# Patient Record
Sex: Male | Born: 1955 | Race: White | Hispanic: No | Marital: Married | Smoking: Current every day smoker
Health system: Southern US, Community
[De-identification: ages and names within clinical notes are randomized; demographics above are authoritative.]

## PROBLEM LIST (undated history)

## (undated) DIAGNOSIS — Z9989 Dependence on other enabling machines and devices: Secondary | ICD-10-CM

## (undated) DIAGNOSIS — K219 Gastro-esophageal reflux disease without esophagitis: Secondary | ICD-10-CM

## (undated) DIAGNOSIS — G629 Polyneuropathy, unspecified: Secondary | ICD-10-CM

## (undated) DIAGNOSIS — F4310 Post-traumatic stress disorder, unspecified: Secondary | ICD-10-CM

## (undated) DIAGNOSIS — K900 Celiac disease: Secondary | ICD-10-CM

## (undated) DIAGNOSIS — M109 Gout, unspecified: Secondary | ICD-10-CM

## (undated) DIAGNOSIS — J189 Pneumonia, unspecified organism: Secondary | ICD-10-CM

## (undated) DIAGNOSIS — E785 Hyperlipidemia, unspecified: Secondary | ICD-10-CM

## (undated) DIAGNOSIS — Z87442 Personal history of urinary calculi: Secondary | ICD-10-CM

## (undated) DIAGNOSIS — J439 Emphysema, unspecified: Secondary | ICD-10-CM

## (undated) DIAGNOSIS — D649 Anemia, unspecified: Secondary | ICD-10-CM

## (undated) DIAGNOSIS — I10 Essential (primary) hypertension: Secondary | ICD-10-CM

## (undated) DIAGNOSIS — M1990 Unspecified osteoarthritis, unspecified site: Secondary | ICD-10-CM

## (undated) DIAGNOSIS — H9190 Unspecified hearing loss, unspecified ear: Secondary | ICD-10-CM

## (undated) DIAGNOSIS — K5090 Crohn's disease, unspecified, without complications: Secondary | ICD-10-CM

## (undated) DIAGNOSIS — Z972 Presence of dental prosthetic device (complete) (partial): Secondary | ICD-10-CM

## (undated) DIAGNOSIS — K589 Irritable bowel syndrome without diarrhea: Secondary | ICD-10-CM

## (undated) DIAGNOSIS — F319 Bipolar disorder, unspecified: Secondary | ICD-10-CM

## (undated) HISTORY — PX: SHOULDER SURGERY: SHX246

## (undated) HISTORY — PX: OTHER SURGICAL HISTORY: SHX169

## (undated) HISTORY — PX: EYE SURGERY: SHX253

## (undated) HISTORY — DX: Hyperlipidemia, unspecified: E78.5

## (undated) HISTORY — PX: HERNIA REPAIR: SHX51

## (undated) HISTORY — PX: COLONOSCOPY: SHX174

---

## 1992-06-27 HISTORY — PX: EXPLORATORY LAPAROTOMY: SUR591

## 2015-05-01 HISTORY — PX: CYSTOSCOPY W/ URETEROSCOPY W/ LITHOTRIPSY: SUR380

## 2017-06-27 DIAGNOSIS — Z9289 Personal history of other medical treatment: Secondary | ICD-10-CM

## 2017-06-27 HISTORY — DX: Personal history of other medical treatment: Z92.89

## 2017-12-07 HISTORY — PX: CERVICAL SPINE SURGERY: SHX589

## 2018-07-02 DIAGNOSIS — R937 Abnormal findings on diagnostic imaging of other parts of musculoskeletal system: Secondary | ICD-10-CM | POA: Diagnosis not present

## 2018-07-02 DIAGNOSIS — E538 Deficiency of other specified B group vitamins: Secondary | ICD-10-CM | POA: Diagnosis not present

## 2018-07-13 DIAGNOSIS — E538 Deficiency of other specified B group vitamins: Secondary | ICD-10-CM

## 2018-07-13 DIAGNOSIS — R937 Abnormal findings on diagnostic imaging of other parts of musculoskeletal system: Secondary | ICD-10-CM | POA: Diagnosis not present

## 2019-01-26 HISTORY — PX: UPPER GI ENDOSCOPY: SHX6162

## 2019-09-25 ENCOUNTER — Other Ambulatory Visit (HOSPITAL_COMMUNITY): Payer: Self-pay | Admitting: Family Medicine

## 2019-09-25 ENCOUNTER — Other Ambulatory Visit: Payer: Self-pay | Admitting: Family Medicine

## 2019-09-25 DIAGNOSIS — M5137 Other intervertebral disc degeneration, lumbosacral region: Secondary | ICD-10-CM

## 2019-10-05 ENCOUNTER — Ambulatory Visit (HOSPITAL_COMMUNITY)
Admission: RE | Admit: 2019-10-05 | Discharge: 2019-10-05 | Disposition: A | Discharge status: Home / Self Care | Payer: Medicare Other | Source: Ambulatory Visit | Attending: Family Medicine | Admitting: Family Medicine

## 2019-10-05 ENCOUNTER — Other Ambulatory Visit: Payer: Self-pay

## 2019-10-05 DIAGNOSIS — M5137 Other intervertebral disc degeneration, lumbosacral region: Secondary | ICD-10-CM | POA: Insufficient documentation

## 2019-10-26 DIAGNOSIS — J449 Chronic obstructive pulmonary disease, unspecified: Secondary | ICD-10-CM

## 2019-10-26 DIAGNOSIS — R911 Solitary pulmonary nodule: Secondary | ICD-10-CM

## 2019-10-26 HISTORY — DX: Solitary pulmonary nodule: R91.1

## 2019-10-26 HISTORY — DX: Chronic obstructive pulmonary disease, unspecified: J44.9

## 2019-11-19 ENCOUNTER — Ambulatory Visit: Payer: Self-pay | Admitting: Orthopedic Surgery

## 2019-12-03 ENCOUNTER — Ambulatory Visit: Payer: Self-pay | Admitting: Orthopedic Surgery

## 2019-12-03 NOTE — H&P (Addendum)
Subjective:   Logan Roth is a pleasant 64 year old male with chronic pain (in [place] pain management on Percocet 15 mg 5 times a day, Gabapentin 200 mg TID and belbuca 2 mg 1X per day; has tried many injections in the past ?S1 SNRB versus SI joint? which were not helpful) He was referred to [place] for evaluation of his L1 compression fracture. He states that on Valentine's Day he fell backwards and has been having pain since then. This midline low back pain has been progressively worsening.  He would therefore like to move forward with surgical intervention.  Problems Reviewed Problems Hypercholesterolemia - Onset: 10/15/2019 Hypertensive disorder - Onset: 10/15/2019 Pulmonary emphysema - Onset: 10/15/2019 Chronic obstructive lung disease - Onset: 10/15/2019 Chronic back pain - Onset: 10/15/2019 History of calculus of kidney - Onset: 10/15/2019  Family History Reviewed Family History  Social History Reviewed Social History Tobacco Smoking Status: Current every day smoker Smoker (1 PPD) Occupation: Retired Chewing tobacco: none Alcohol intake: None Work related injury?: N Advance directive: N Freight forwarder of Attorney: N Live alone or with others?: with others Marital status: Married  Surgical History Reviewed Surgical History Open stone operation on kidney or renal pelvis Cervical arthrodesis - [place] Colostomy - 06/28/2019 Cataract - 06/27/2012 Shoulder Arthroscopy - 06/27/2008  Past Medical History Reviewed Past Medical History Anemia: Y Anxiety Disorder: Y Bipolar: Y COPD: Y Chronic Back Pain: Y Colitis/Stomach Ulcers: Y Depression: Y Headaches: Y High Cholesterol: Y Hypertension: Y Joint Pain: Y Osteoarthritis: Y Previous Cortisone Injection(s): Y Swelling of Legs/Feet/Hands: Y Weight loss: Y  Current Outpatient Medications  Medication Sig Dispense Refill Last Dose  . BELBUCA 900 MCG FILM Take 1 strip by mouth 2 (two) times daily.     Marland Kitchen DEXILANT 60 MG  capsule Take 60 mg by mouth daily.     . ferrous sulfate 325 (65 FE) MG tablet Take 325 mg by mouth daily.     Marland Kitchen losartan (COZAAR) 50 MG tablet Take 50 mg by mouth daily.     Marland Kitchen oxyCODONE (ROXICODONE) 15 MG immediate release tablet Take 15 mg by mouth 5 (five) times daily as needed for pain.     . pravastatin (PRAVACHOL) 80 MG tablet Take 40 mg by mouth daily.     . traZODone (DESYREL) 100 MG tablet Take 100 mg by mouth at bedtime as needed.     . vitamin B-12 (CYANOCOBALAMIN) 1000 MCG tablet Take 1,000 mcg by mouth daily.      No current facility-administered medications for this visit.   No Known Allergies  Social History   Tobacco Use  . Smoking status: Not on file  Substance Use Topics  . Alcohol use: Not on file     Review of Systems As stated in HPI  Objective:   Vitals: Ht: 5 ft 8.5 in 12/03/2019 10:55 am Wt: 205 lbs 12/03/2019 10:55 am BMI: 30.7 12/03/2019 10:55 am BP: 136/75 12/03/2019 10:56 am Pulse: 77 bpm 12/03/2019 10:56 am  General: AAOX3, well developed and well nourished, NAD  Ambulation: antalgic gait pattern, uses cane assistive device.  Inspection: No obvious deformity, scoleosis, kyphosis, loss of lordotic curve.  Palpation: Very mildly tender over spinous processes L1. Significant the tender palpation over left SI joint.  AROM: - Significant pain with range of motion of the lumbar spine. - Knee: flexion and extension normal and pain free bilaterally. - Ankle: Dorsiflexion, plantarflexion, inversion, eversion normal and pain free.  Dermatomes: Lower extremity sensation to light touch intact bilaterally  Myotomes: -  Hip Flexion: Left 4/5 (suspect weakness due to pain), Right 4/5 (suspect weakness due to pain) - Knee Extension: Left 5/5, Right 5/5 - Ankle Dorsiflextion: Left 5/5, Right 5/5 - Ankle Plantarflexion: Left 5/5, Right 5/5  Reflexes: - Patella: Left2+, Right 2+ - Achilles: Left2+, Right 2+ - Babinski: Left Ngative, Right Negative -  Clonus: Negative  Special Tests: - Straight Leg Raise: Left Negative, Right Negative  PV: Extremities warm and well profused. Posterior and dorsalis pedis pulse 2+ bilaterally, No pitting Edema, discoloration, calf tenderness, or palpable cords.  X-Ray impression: AP and lateral thoracolumbar x-rays were taken at today's office visit and images were personally reviewed by me. The patient has reduced kyphosis and lumbar lordosis. There is a L1 compression deformity that does not appear to be further collapsed compared to the prior MRI. No new fractures are seen.  X-rays taken today in the office demonstrate no significant change of the L1 compression fracture. No new fracture is seen.   MRI Impression: MRI of lumbar spine taken outside facility dated 10/06/2019. Both images as well as the report were personally reviewed by me. There is a L1 compression fracture with up to 30% height loss without any significant retropulsion or stenosis that is benign and mechanical in appearance. There is also right foraminal and extraforaminal disc protrusions at T12 and L1, L2-3 and L3-4 potentially affecting the exiting right-sided nerve roots. In a disc bulge with facet hypertrophy at L4-5 and L5-S1 with mild to moderate bilateral foraminal narrowing at L4-5 on the left and at L5-S1.  Assessment:   Logan Roth returns today because of ongoing severe debilitating back pain. He states it has intensified since his last visit of 10/15/19. I have reviewed the MRI and the new x-rays and it is clear he has symptomatic back pain due to the L1 compression fracture. At this point we have gone over the kyphoplasty procedure in great detail. All of his and his wife's questions were addressed. He is expressed a desire to move forward with this procedure.  Plan:   Risks of surgery include: Infection, bleeding, death, stroke, paralysis, nerve damage, leak of cement, need for additional surgery including open decompression. Ongoing  or worse pain.  Goals of surgery: Reduction in pain, and improvement in quality of life  We have obtained preoperative medical clearance from the patient's primary care provider as well as his pulmonologist.  He is also scheduled on the 11th to meet with the cardiologist.  He is not on any blood thinners, he is not taking any aspirin.  He is also not using any anti-inflammatory medications.  I have advised him to avoid any anti-inflammatory medications prior to surgery he did express understanding of this.  We have also discussed the post-operative recovery period to include: bathing/showering restrictions,  wound healing, activity (and driving) restrictions, medications/pain mangement.  Patient has an appointment with his pain management provider prior to surgery, they will be managing his postoperative pain.  We have also discussed post-operative redflags to include: signs and symptoms of postoperative infection, DVT/PE.  All of patient's questions were invited and answered  Follow-up: 2 weeks postoperatively

## 2019-12-05 ENCOUNTER — Encounter (HOSPITAL_COMMUNITY): Payer: Self-pay

## 2019-12-05 NOTE — Pre-Procedure Instructions (Signed)
[facility] [place] AT [address] [place] [address] Phone: [phone] Fax: [phone]     Your procedure is scheduled on Thursday, December 12 2019 from 10:40 AM- 11:40 AM.  Report to [facility] Main Entrance "A" at 08:40 A.M., and check in at the Admitting office.  Call this number if you have problems the morning of surgery:  [phone]  Call [phone] if you have any questions prior to your surgery date Monday-Friday 8am-4pm.    Remember:  Do not eat or drink after midnight the night before your surgery.     Take these medicines the morning of surgery with A SIP OF WATER: DEXILANT  pravastatin (PRAVACHOL)  IF NEEDED: oxyCODONE (ROXICODONE)  *Stop BELBUCA 3 days prior to surgery.  As of today, STOP taking any Aspirin (unless otherwise instructed by your surgeon) and Aspirin containing products, Aleve, Naproxen, Ibuprofen, Motrin, Advil, Goody's, BC's, all herbal medications, fish oil, and all vitamins.          The Morning of Surgery:            Do not wear jewelry.            Do not wear lotions, powders, colognes, or deodorant.            Men may shave face and neck.            Do not bring valuables to the hospital.            [facility] is not responsible for any belongings or valuables.  Do NOT Smoke (Tobacco/Vapping) or drink Alcohol 24 hours prior to your procedure.  If you use a CPAP at night, you may bring all equipment for your overnight stay.   Contacts, glasses, dentures or bridgework may not be worn into surgery.      For patients admitted to the hospital, discharge time will be determined by your treatment team.   Patients discharged the day of surgery will not be allowed to drive home, and someone needs to stay with them for 24 hours.    Special instructions:   [place]- Preparing For Surgery  Before surgery, you can play an important role. Because skin is  not sterile, your skin needs to be as free of germs as possible. You can reduce the number of germs on your skin by washing with CHG (chlorahexidine gluconate) Soap before surgery.  CHG is an antiseptic cleaner which kills germs and bonds with the skin to continue killing germs even after washing.    Oral Hygiene is also important to reduce your risk of infection.  Remember - BRUSH YOUR TEETH THE MORNING OF SURGERY WITH YOUR REGULAR TOOTHPASTE  Please do not use if you have an allergy to CHG or antibacterial soaps. If your skin becomes reddened/irritated stop using the CHG.  Do not shave (including legs and underarms) for at least 48 hours prior to first CHG shower. It is OK to shave your face.  Please follow these instructions carefully.   1. Shower the NIGHT BEFORE SURGERY and the MORNING OF SURGERY with CHG Soap.   2. If you chose to wash your hair, wash your hair first as usual with your normal shampoo.  3. After you shampoo, rinse your hair and body thoroughly to remove the shampoo.  4. Use CHG as you would any other liquid soap. You can apply  CHG directly to the skin and wash gently with a scrungie or a clean washcloth.   5. Apply the CHG Soap to your body ONLY FROM THE NECK DOWN.  Do not use on open wounds or open sores. Avoid contact with your eyes, ears, mouth and genitals (private parts). Wash Face and genitals (private parts)  with your normal soap.   6. Wash thoroughly, paying special attention to the area where your surgery will be performed.  7. Thoroughly rinse your body with warm water from the neck down.  8. DO NOT shower/wash with your normal soap after using and rinsing off the CHG Soap.  9. Pat yourself dry with a CLEAN TOWEL.  10. Wear CLEAN PAJAMAS to bed the night before surgery, wear comfortable clothes the morning of surgery  11. Place CLEAN SHEETS on your bed the night of your first shower and DO NOT SLEEP WITH PETS.   Day of Surgery: Shower with CHG Soap.   Do not apply any deodorants/lotions.  Please wear clean clothes to the hospital/surgery center.   Remember to brush your teeth WITH YOUR REGULAR TOOTHPASTE.   Please read over the following fact sheets that you were given.

## 2019-12-05 NOTE — Progress Notes (Signed)
Patient denies shortness of breath, fever, cough or chest pain.  PCP - Dr Andrey Campanile  Pain Mgmt - Dr Mikael Spray Cardiologist - n/a Pulmonology - Dr Paris Lore  Chest x-ray - 12/06/19 EKG - 12/06/19 Stress Test - n/a - one is scheduled 12/09/19  ECHO - n/a Cardiac Cath - n/a  Anesthesia review: Yes  Per Anesthesia, Stop Belbuca 3 days prior to surgery if ok with your prescribing MD.  Coronavirus Screening Covid test scheduled on Monday, 12/09/19. Do you have any of the following symptoms:  Cough yes/no: No Fever (>100.52F)  yes/no: No Runny nose yes/no: No Sore throat yes/no: No Difficulty breathing/shortness of breath  yes/no: No  Have you traveled in the last 14 days and where? yes/no: No  Patient verbalized understanding of instructions that were given to them at the PAT appointment.

## 2019-12-06 ENCOUNTER — Other Ambulatory Visit: Payer: Self-pay

## 2019-12-06 ENCOUNTER — Encounter (HOSPITAL_COMMUNITY): Payer: Self-pay | Admitting: Physician Assistant

## 2019-12-06 ENCOUNTER — Ambulatory Visit (HOSPITAL_COMMUNITY)
Admission: RE | Admit: 2019-12-06 | Discharge: 2019-12-06 | Disposition: A | Discharge status: Home / Self Care | Payer: Medicare Other | Source: Ambulatory Visit | Attending: Orthopedic Surgery | Admitting: Orthopedic Surgery

## 2019-12-06 ENCOUNTER — Encounter (HOSPITAL_COMMUNITY)
Admission: RE | Admit: 2019-12-06 | Discharge: 2019-12-06 | Disposition: A | Discharge status: Home / Self Care | Payer: Medicare Other | Source: Ambulatory Visit | Attending: Orthopedic Surgery | Admitting: Orthopedic Surgery

## 2019-12-06 ENCOUNTER — Encounter (HOSPITAL_COMMUNITY): Payer: Self-pay

## 2019-12-06 DIAGNOSIS — Z01818 Encounter for other preprocedural examination: Secondary | ICD-10-CM | POA: Diagnosis present

## 2019-12-06 DIAGNOSIS — I2510 Atherosclerotic heart disease of native coronary artery without angina pectoris: Secondary | ICD-10-CM | POA: Diagnosis not present

## 2019-12-06 DIAGNOSIS — Z8249 Family history of ischemic heart disease and other diseases of the circulatory system: Secondary | ICD-10-CM | POA: Diagnosis not present

## 2019-12-06 HISTORY — DX: Crohn's disease, unspecified, without complications: K50.90

## 2019-12-06 HISTORY — DX: Unspecified hearing loss, unspecified ear: H91.90

## 2019-12-06 HISTORY — DX: Dependence on other enabling machines and devices: Z99.89

## 2019-12-06 HISTORY — DX: Irritable bowel syndrome without diarrhea: K58.9

## 2019-12-06 HISTORY — DX: Gastro-esophageal reflux disease without esophagitis: K21.9

## 2019-12-06 HISTORY — DX: Essential (primary) hypertension: I10

## 2019-12-06 HISTORY — DX: Celiac disease: K90.0

## 2019-12-06 HISTORY — DX: Emphysema, unspecified: J43.9

## 2019-12-06 HISTORY — DX: Anemia, unspecified: D64.9

## 2019-12-06 HISTORY — DX: Personal history of urinary calculi: Z87.442

## 2019-12-06 HISTORY — DX: Presence of dental prosthetic device (complete) (partial): Z97.2

## 2019-12-06 HISTORY — DX: Gout, unspecified: M10.9

## 2019-12-06 HISTORY — DX: Pneumonia, unspecified organism: J18.9

## 2019-12-06 HISTORY — DX: Hyperlipidemia, unspecified: E78.5

## 2019-12-06 HISTORY — DX: Unspecified osteoarthritis, unspecified site: M19.90

## 2019-12-06 LAB — CBC
HCT: 44.5 % (ref 39.0–52.0)
Hemoglobin: 13.7 g/dL (ref 13.0–17.0)
MCH: 28.3 pg (ref 26.0–34.0)
MCHC: 30.8 g/dL (ref 30.0–36.0)
MCV: 91.9 fL (ref 80.0–100.0)
Platelets: 383 10*3/uL (ref 150–400)
RBC: 4.84 MIL/uL (ref 4.22–5.81)
RDW: 15.6 % — ABNORMAL HIGH (ref 11.5–15.5)
WBC: 9.7 10*3/uL (ref 4.0–10.5)
nRBC: 0 % (ref 0.0–0.2)

## 2019-12-06 LAB — BASIC METABOLIC PANEL
Anion gap: 12 (ref 5–15)
BUN: 12 mg/dL (ref 8–23)
CO2: 23 mmol/L (ref 22–32)
Calcium: 9.1 mg/dL (ref 8.9–10.3)
Chloride: 108 mmol/L (ref 98–111)
Creatinine, Ser: 1.1 mg/dL (ref 0.61–1.24)
GFR calc Af Amer: >60 mL/min (ref >60)
GFR calc non Af Amer: >60 mL/min (ref >60)
Glucose, Bld: 117 mg/dL — ABNORMAL HIGH (ref 70–99)
Potassium: 4.2 mmol/L (ref 3.5–5.1)
Sodium: 143 mmol/L (ref 135–145)

## 2019-12-06 LAB — APTT: aPTT: 28 seconds (ref 24–36)

## 2019-12-06 LAB — PROTIME-INR
INR: 1 (ref 0.8–1.2)
Prothrombin Time: 13.1 seconds (ref 11.4–15.2)

## 2019-12-06 LAB — TYPE AND SCREEN
ABO/RH(D): A POS
Antibody Screen: NEGATIVE

## 2019-12-06 LAB — ABO/RH: ABO/RH(D): A POS

## 2019-12-06 LAB — SURGICAL PCR SCREEN
MRSA, PCR: NEGATIVE
Staphylococcus aureus: NEGATIVE

## 2019-12-09 ENCOUNTER — Other Ambulatory Visit (HOSPITAL_COMMUNITY): Payer: Medicare Other

## 2019-12-11 ENCOUNTER — Telehealth (HOSPITAL_COMMUNITY): Payer: Self-pay | Admitting: *Deleted

## 2019-12-11 ENCOUNTER — Other Ambulatory Visit (HOSPITAL_COMMUNITY): Payer: Self-pay | Admitting: Family Medicine

## 2019-12-11 DIAGNOSIS — I2510 Atherosclerotic heart disease of native coronary artery without angina pectoris: Secondary | ICD-10-CM

## 2019-12-11 NOTE — Progress Notes (Signed)
Anesthesia Chart Review:  Reviewed office visit note dated 12/06/2019 from Dr. Mikael Spray at [facility].  Note states patient had three-vessel coronary calcification on recent CTA and strong family history of CAD with both his mother and father suffering fatal myocardial infarctions in their 65s.  It was recommended he undergo nuclear stress test prior to undergoing kyphoplasty procedure.  It appears she is originally scheduled to have this testing on 12/09/2019 but for unclear reasons it was rescheduled.  He has not had the stress test at this time.  Given this, he will need to have his kyphoplasty procedure rescheduled once he has completed the recommended cardiovascular testing.  I have relayed this message to Dr. Gary Fleet surgical scheduler.   Zannie Cove [facility] Short Stay Center/Anesthesiology Phone [phone] 12/11/2019 10:42 AM

## 2019-12-11 NOTE — Telephone Encounter (Signed)
Patient given detailed instructions per Myocardial Perfusion Study Information Sheet for the test on 12/13/2019 at 1030. Patient notified to arrive 15 minutes early and that it is imperative to arrive on time for appointment to keep from having the test rescheduled.  If you need to cancel or reschedule your appointment, please call the office within 24 hours of your appointment. . Patient verbalized understanding.Logan Roth, Logan Roth No mychart available

## 2019-12-12 ENCOUNTER — Encounter (HOSPITAL_COMMUNITY): Admission: RE | Payer: Self-pay | Source: Home / Self Care

## 2019-12-12 ENCOUNTER — Ambulatory Visit (HOSPITAL_COMMUNITY): Admission: RE | Admit: 2019-12-12 | Payer: Medicare Other | Source: Home / Self Care | Admitting: Orthopedic Surgery

## 2019-12-12 SURGERY — KYPHOPLASTY
Anesthesia: General

## 2019-12-13 ENCOUNTER — Ambulatory Visit (HOSPITAL_COMMUNITY): Payer: Medicare Other | Attending: Cardiovascular Disease

## 2019-12-13 ENCOUNTER — Other Ambulatory Visit: Payer: Self-pay

## 2019-12-13 DIAGNOSIS — I2510 Atherosclerotic heart disease of native coronary artery without angina pectoris: Secondary | ICD-10-CM | POA: Insufficient documentation

## 2019-12-13 LAB — MYOCARDIAL PERFUSION IMAGING
LV dias vol: 106 mL (ref 62–150)
LV sys vol: 51 mL
Peak HR: 81 {beats}/min
Rest HR: 65 {beats}/min
SDS: 3
SRS: 0
SSS: 3
TID: 1.05

## 2019-12-13 MED ORDER — TECHNETIUM TC 99M TETROFOSMIN IV KIT
33.0000 | PACK | Freq: Once | INTRAVENOUS | Status: AC | PRN
  Administered 2019-12-13: 33 via INTRAVENOUS
  Filled 2019-12-13: qty 33

## 2019-12-13 MED ORDER — TECHNETIUM TC 99M TETROFOSMIN IV KIT
9.8000 | PACK | Freq: Once | INTRAVENOUS | Status: AC | PRN
  Administered 2019-12-13: 9.8 via INTRAVENOUS
  Filled 2019-12-13: qty 10

## 2019-12-13 MED ORDER — REGADENOSON 0.4 MG/5ML IV SOLN
0.4000 mg | Freq: Once | INTRAVENOUS | Status: AC
  Administered 2019-12-13: 0.4 mg via INTRAVENOUS

## 2019-12-16 ENCOUNTER — Telehealth: Payer: Self-pay | Admitting: Family Medicine

## 2019-12-16 NOTE — Telephone Encounter (Signed)
Follow Up:   Logan Roth from Dr Dairl Ponder office called. She is checking on the status of pt's Stress Test that he need for surgical clearance. Please fax this to  [phone].

## 2019-12-16 NOTE — Telephone Encounter (Signed)
I s/w Sheery with Dr. Shon Baton office. Cordelia Pen was asking if we could please fax stress test results. I explained to [facility] that Mr. Hawthorne has never been seen by our cardiology office and the stress test was ordered by the PCP. I gave Sheery both the ph# and fax # for Dr. Mikael Spray (PCP) and that she will need to contact their office for the stress test results.

## 2019-12-23 ENCOUNTER — Telehealth: Payer: Self-pay | Admitting: Family Medicine

## 2019-12-23 NOTE — Telephone Encounter (Signed)
Tammy, from [facility] called for Stress test results to this patient. It can be faxed to [phone]

## 2019-12-23 NOTE — Telephone Encounter (Signed)
Attempted to call [facility] x 2 and was disconnected both times.  Called to assist with questions about getting pts stress test results, which were ordered by PCP.  Pt is not established with our Group.  Appears PCP ordered stress test on this pt and was done at our facility, but results to go back to PCP to further advise on surgical clearance needed, based on results once read by our establishment.

## 2019-12-24 ENCOUNTER — Ambulatory Visit: Payer: Self-pay | Admitting: Orthopedic Surgery

## 2019-12-26 NOTE — Progress Notes (Signed)
Anesthesia Chart Review: Same day workup  Case previously postponed due to need for preop cardiac testing. See my recent note 12/06/19. In the interim pt has been seen by cardiologist Dr. Clelia Croft at [facility] medical and underwent nuclear stress that was normal and he was subsequently cleared for surgery. Per clearance note dated 12/23/19, "Logan Roth has been under my care for his cardiovascular health.  His cardiovascular condition remained stable with updated nuclear stress test being acquired at [facility] and vascular Center on December 13, 2019.  Patient was identified to have apical hypokinesis by gated images with an overall preserved ejection fraction 52%.  Myocardial perfusion was normal showing no reversible or fixed defects.  Patient overall is medically stable and hemodynamically ready to proceed with elective lumbar orthopedic surgery.  Overall perioperative risk of cardiovascular complications are low and patient is otherwise cleared to proceed with L1 kyphoplasty with Dr. Venita Lick."  Patient follows with pulmonologist Dr. Sande Brothers for history of emphysema.  Preop clearance dated 11/29/2019 states "patient has no contraindications to surgery and appears to be at low risk for perisurgical complications.  He has mild airway obstruction on medical therapy for emphysema."  Patient also has medical clearance from PCP Dr. Andrey Campanile dated 11/28/2019.  Labs from 12/06/2019 were unremarkable.   EKG 12/06/2019: Normal sinus rhythm.  Rate 68.  Right bundle branch block. Left anterior fascicular block  CHEST - 2 VIEW 12/06/2019:  COMPARISON:  09/20/2018  FINDINGS: The heart size and mediastinal contours are within normal limits. Both lungs are clear. The visualized skeletal structures are unremarkable.  IMPRESSION: No active cardiopulmonary disease.   Nuclear stress 12/13/2019: Normal perfusion Ef estimated at 52% with apical hypokinesis on gated images LV appears enlarged Low risk study no  ischemia Baseline ECG with PAC and RBBB   Zannie Cove [facility] Short Stay Center/Anesthesiology Phone [phone] 12/26/2019 4:20 PM

## 2019-12-26 NOTE — Anesthesia Preprocedure Evaluation (Addendum)
Anesthesia Evaluation  Patient identified by MRN, date of birth, ID band Patient awake    Reviewed: Allergy & Precautions, NPO status , Patient's Chart, lab work & pertinent test results  History of Anesthesia Complications Negative for: history of anesthetic complications  Airway Mallampati: II  TM Distance: >3 FB Neck ROM: Limited    Dental  (+) Lower Dentures, Upper Dentures   Pulmonary COPD, Current SmokerPatient did not abstain from smoking.,    Pulmonary exam normal        Cardiovascular hypertension, Pt. on medications Normal cardiovascular exam     Neuro/Psych PSYCHIATRIC DISORDERS Anxiety Bipolar Disorder  Neuromuscular disease (neuropathy)    GI/Hepatic Neg liver ROS, GERD  Controlled and Medicated, Celiac disease Crohn's Disease   Endo/Other   Obesity   Renal/GU negative Renal ROS     Musculoskeletal  (+) Arthritis ,   Abdominal   Peds  Hematology negative hematology ROS (+)   Anesthesia Other Findings On belbuca Covid test negative See PAT note regarding cardiac clearance  Reproductive/Obstetrics                           Anesthesia Physical Anesthesia Plan  ASA: III  Anesthesia Plan: MAC   Post-op Pain Management:    Induction: Intravenous  PONV Risk Score and Plan: 1 and Propofol infusion and Treatment may vary due to age or medical condition  Airway Management Planned: Nasal Cannula and Natural Airway  Additional Equipment: None  Intra-op Plan:   Post-operative Plan:   Informed Consent: I have reviewed the patients History and Physical, chart, labs and discussed the procedure including the risks, benefits and alternatives for the proposed anesthesia with the patient or authorized representative who has indicated his/her understanding and acceptance.       Plan Discussed with: CRNA, Anesthesiologist and Surgeon  Anesthesia Plan Comments:       Anesthesia Quick Evaluation

## 2019-12-31 ENCOUNTER — Encounter (HOSPITAL_COMMUNITY): Payer: Self-pay

## 2019-12-31 ENCOUNTER — Other Ambulatory Visit (HOSPITAL_COMMUNITY)
Admission: RE | Admit: 2019-12-31 | Discharge: 2019-12-31 | Disposition: A | Discharge status: Home / Self Care | Payer: Medicare Other | Source: Ambulatory Visit | Attending: Orthopedic Surgery | Admitting: Orthopedic Surgery

## 2019-12-31 ENCOUNTER — Encounter (HOSPITAL_COMMUNITY)
Admission: RE | Admit: 2019-12-31 | Discharge: 2019-12-31 | Disposition: A | Discharge status: Home / Self Care | Payer: Medicare Other | Source: Ambulatory Visit | Attending: Orthopedic Surgery | Admitting: Orthopedic Surgery

## 2019-12-31 ENCOUNTER — Ambulatory Visit: Payer: Self-pay | Admitting: Orthopedic Surgery

## 2019-12-31 ENCOUNTER — Other Ambulatory Visit: Payer: Self-pay

## 2019-12-31 DIAGNOSIS — Z20822 Contact with and (suspected) exposure to covid-19: Secondary | ICD-10-CM | POA: Insufficient documentation

## 2019-12-31 DIAGNOSIS — Z01812 Encounter for preprocedural laboratory examination: Secondary | ICD-10-CM | POA: Diagnosis not present

## 2019-12-31 HISTORY — DX: Post-traumatic stress disorder, unspecified: F43.10

## 2019-12-31 HISTORY — DX: Polyneuropathy, unspecified: G62.9

## 2019-12-31 HISTORY — DX: Bipolar disorder, unspecified: F31.9

## 2019-12-31 LAB — BASIC METABOLIC PANEL
Anion gap: 11 (ref 5–15)
BUN: 13 mg/dL (ref 8–23)
CO2: 22 mmol/L (ref 22–32)
Calcium: 9 mg/dL (ref 8.9–10.3)
Chloride: 108 mmol/L (ref 98–111)
Creatinine, Ser: 1.11 mg/dL (ref 0.61–1.24)
GFR calc Af Amer: >60 mL/min (ref >60)
GFR calc non Af Amer: >60 mL/min (ref >60)
Glucose, Bld: 111 mg/dL — ABNORMAL HIGH (ref 70–99)
Potassium: 4.6 mmol/L (ref 3.5–5.1)
Sodium: 141 mmol/L (ref 135–145)

## 2019-12-31 LAB — TYPE AND SCREEN
ABO/RH(D): A POS
Antibody Screen: NEGATIVE

## 2019-12-31 LAB — CBC
HCT: 42.4 % (ref 39.0–52.0)
Hemoglobin: 13.1 g/dL (ref 13.0–17.0)
MCH: 28 pg (ref 26.0–34.0)
MCHC: 30.9 g/dL (ref 30.0–36.0)
MCV: 90.6 fL (ref 80.0–100.0)
Platelets: 341 10*3/uL (ref 150–400)
RBC: 4.68 MIL/uL (ref 4.22–5.81)
RDW: 14.9 % (ref 11.5–15.5)
WBC: 4.7 10*3/uL (ref 4.0–10.5)
nRBC: 0 % (ref 0.0–0.2)

## 2019-12-31 LAB — SARS CORONAVIRUS 2 (TAT 6-24 HRS): SARS Coronavirus 2: NEGATIVE

## 2019-12-31 NOTE — H&P (Deleted)
  The note originally documented on this encounter has been moved the the encounter in which it belongs.  

## 2019-12-31 NOTE — Telephone Encounter (Signed)
Results faxed to the number provided

## 2019-12-31 NOTE — Pre-Procedure Instructions (Signed)
Logan Roth  12/31/2019    Your procedure is scheduled on Thursday, July 8..  Report to [facility], Main Entrance or Entrance "A" at 8:50 AM                   Your surgery or procedure is scheduled to begin at 10:50 A.M.   Call this number if you have problems the morning of surgery: [phone]  This is the number for the Pre- Surgical Desk.                For any other questions, please call [phone], Monday - Friday 8 AM - 4 PM.   Remember:  Do not eat or drink after midnight the night before your surgery.   Take these medicines the morning of surgery with A SIP OF WATER: amLODipine (NORVASC)      DEXILANT       DULoxetine (CYMBALTA) Tiotropium Bromide Monohydrate (SPIRIVA RESPIMAT)   Take if needed:  oxyCODONE (ROXICODONE) albuterol (VENTOLIN HFA) inhaler- bring it with you to the hospital.  Stop Belbuca 3 days prior to surgery   STOP taking Aspirin, Aspirin Products (Goody Powder, Excedrin Migraine), Ibuprofen (Advil), Naproxen (Aleve), Vitamins and Herbal Products (ie Fish Oil). and Loratadine-pseudoephedrine (CLARITIN-D 12-HOUR).  Special instructions:  [place]- Preparing For Surgery  Before surgery, you can play an important role. Because skin is not sterile, your skin needs to be as free of germs as possible. You can reduce the number of germs on your skin by washing with CHG (chlorahexidine gluconate) Soap before surgery.  CHG is an antiseptic cleaner which kills germs and bonds with the skin to continue killing germs even after washing.    Oral Hygiene is also important to reduce your risk of infection.  Remember - BRUSH YOUR TEETH THE MORNING OF SURGERY WITH YOUR REGULAR TOOTHPASTE  Please do not use if you have an allergy to CHG or antibacterial soaps. If your skin becomes reddened/irritated stop using the CHG.  Do not shave (including legs and underarms) for at least 48 hours prior to first CHG shower. It is OK to shave your face.  Please  follow these instructions carefully.   1. Shower the NIGHT BEFORE SURGERY and the MORNING OF SURGERY with CHG.   2. If you chose to wash your hair, wash your hair first as usual with your normal shampoo.  3. After you shampoo, wash your face and private area with the soap you use at home, then rinse your hair and body thoroughly to remove the shampoo and soap.   4. Use CHG as you would any other liquid soap. You can apply CHG directly to the skin and wash gently with a scrungie or a clean washcloth.   5. Apply the CHG Soap to your body ONLY FROM THE NECK DOWN.  Do not use on open wounds or open sores. Avoid contact with your eyes, ears, mouth and genitals (private parts).  6. Wash thoroughly, paying special attention to the area where your surgery will be performed.  7. Thoroughly rinse your body with warm water from the neck down.  8. DO NOT shower/wash with your normal soap after using and rinsing off the CHG Soap.  9. Pat yourself dry with a CLEAN TOWEL.  10. Wear CLEAN PAJAMAS to bed the night before surgery, wear comfortable clothes the morning of surgery  11. Place CLEAN SHEETS on your bed the night of your first shower and DO NOT SLEEP  WITH PETS.  Day of Surgery: Shower as instructed above. Do not wear lotions, powders, or cologne, or deodorant. Please wear clean clothes to the hospital/surgery center.   Remember to brush your teeth WITH YOUR REGULAR TOOTHPASTE.             Do not wear jewelry, make-up or nail polish.             Men may shave face and neck.  Do not bring valuables to the hospital.  [facility] is not responsible for any belongings or valuables.  Contacts, dentures or bridgework may not be worn into surgery.  Leave your suitcase in the car.  After surgery it may be brought to your room.  For patients admitted to the hospital, discharge time will be determined by your treatment team.  Patients discharged the day of surgery will not be allowed to drive  home.   Please read over the fact sheets that you were given.

## 2019-12-31 NOTE — Progress Notes (Signed)
I called Dr. Shon Baton' office and left [facility] a voice message asking for lab orders by 1100 and informed her that if we do not see new orders that labs anesthesiologist; CBC, BMP, T/S.
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
## 2019-12-31 NOTE — Progress Notes (Signed)
PCP - Dr. Benedetto Goad-   Cardiologist -  Dr. Fritz Pickerel- cardiac clearance given  Pulmonologist Dr. Sande Brothers- gave pulmonary clearance  Chest x-ray -  12/06/2019  EKG - 12/06/2019  Stress Test - 12/13/2019  ECHO - no  Cardiac Cath - no  Sleep Study - test was negative  CPAP - no  LABS-CBC,BMP, T?S.  ASA-no  ERAS-yes  HA1C-na Fasting Blood Sugar - na Checks Blood Sugar ___na__ times a day  Anesthesia-  Pt denies having chest pain, sob, or fever at this time. All instructions explained to the pt, with a verbal understanding of the material. Pt agrees to go over the instructions while at home for a better understanding. Pt also instructed to self quarantine after being tested for COVID-19. The opportunity to ask questions was provided.

## 2020-01-02 ENCOUNTER — Ambulatory Visit (HOSPITAL_COMMUNITY)
Admission: RE | Admit: 2020-01-02 | Discharge: 2020-01-02 | Disposition: A | Discharge status: Home / Self Care | Payer: Medicare Other | Attending: Orthopedic Surgery | Admitting: Orthopedic Surgery

## 2020-01-02 ENCOUNTER — Encounter (HOSPITAL_COMMUNITY): Payer: Self-pay | Admitting: Orthopedic Surgery

## 2020-01-02 ENCOUNTER — Ambulatory Visit (HOSPITAL_COMMUNITY): Payer: Medicare Other | Admitting: Physician Assistant

## 2020-01-02 ENCOUNTER — Encounter (HOSPITAL_COMMUNITY)
Admission: RE | Disposition: A | Discharge status: Home / Self Care | Payer: Self-pay | Source: Home / Self Care | Attending: Orthopedic Surgery

## 2020-01-02 ENCOUNTER — Ambulatory Visit (HOSPITAL_COMMUNITY): Payer: Medicare Other

## 2020-01-02 ENCOUNTER — Other Ambulatory Visit: Payer: Self-pay

## 2020-01-02 DIAGNOSIS — E7800 Pure hypercholesterolemia, unspecified: Secondary | ICD-10-CM | POA: Diagnosis not present

## 2020-01-02 DIAGNOSIS — K5090 Crohn's disease, unspecified, without complications: Secondary | ICD-10-CM | POA: Diagnosis not present

## 2020-01-02 DIAGNOSIS — X58XXXA Exposure to other specified factors, initial encounter: Secondary | ICD-10-CM | POA: Insufficient documentation

## 2020-01-02 DIAGNOSIS — Z8249 Family history of ischemic heart disease and other diseases of the circulatory system: Secondary | ICD-10-CM | POA: Diagnosis not present

## 2020-01-02 DIAGNOSIS — F319 Bipolar disorder, unspecified: Secondary | ICD-10-CM | POA: Insufficient documentation

## 2020-01-02 DIAGNOSIS — E669 Obesity, unspecified: Secondary | ICD-10-CM | POA: Diagnosis not present

## 2020-01-02 DIAGNOSIS — I2510 Atherosclerotic heart disease of native coronary artery without angina pectoris: Secondary | ICD-10-CM | POA: Insufficient documentation

## 2020-01-02 DIAGNOSIS — G8929 Other chronic pain: Secondary | ICD-10-CM | POA: Diagnosis not present

## 2020-01-02 DIAGNOSIS — Z79891 Long term (current) use of opiate analgesic: Secondary | ICD-10-CM | POA: Insufficient documentation

## 2020-01-02 DIAGNOSIS — G629 Polyneuropathy, unspecified: Secondary | ICD-10-CM | POA: Diagnosis not present

## 2020-01-02 DIAGNOSIS — I10 Essential (primary) hypertension: Secondary | ICD-10-CM | POA: Diagnosis not present

## 2020-01-02 DIAGNOSIS — Z79899 Other long term (current) drug therapy: Secondary | ICD-10-CM | POA: Insufficient documentation

## 2020-01-02 DIAGNOSIS — E559 Vitamin D deficiency, unspecified: Secondary | ICD-10-CM | POA: Diagnosis not present

## 2020-01-02 DIAGNOSIS — M1990 Unspecified osteoarthritis, unspecified site: Secondary | ICD-10-CM | POA: Diagnosis not present

## 2020-01-02 DIAGNOSIS — J449 Chronic obstructive pulmonary disease, unspecified: Secondary | ICD-10-CM | POA: Diagnosis not present

## 2020-01-02 DIAGNOSIS — F17210 Nicotine dependence, cigarettes, uncomplicated: Secondary | ICD-10-CM | POA: Diagnosis not present

## 2020-01-02 DIAGNOSIS — K219 Gastro-esophageal reflux disease without esophagitis: Secondary | ICD-10-CM | POA: Diagnosis not present

## 2020-01-02 DIAGNOSIS — S32019A Unspecified fracture of first lumbar vertebra, initial encounter for closed fracture: Secondary | ICD-10-CM | POA: Diagnosis not present

## 2020-01-02 DIAGNOSIS — F419 Anxiety disorder, unspecified: Secondary | ICD-10-CM | POA: Diagnosis not present

## 2020-01-02 DIAGNOSIS — K900 Celiac disease: Secondary | ICD-10-CM | POA: Insufficient documentation

## 2020-01-02 DIAGNOSIS — I4510 Unspecified right bundle-branch block: Secondary | ICD-10-CM | POA: Diagnosis not present

## 2020-01-02 DIAGNOSIS — E785 Hyperlipidemia, unspecified: Secondary | ICD-10-CM | POA: Diagnosis not present

## 2020-01-02 DIAGNOSIS — M4856XA Collapsed vertebra, not elsewhere classified, lumbar region, initial encounter for fracture: Secondary | ICD-10-CM | POA: Diagnosis present

## 2020-01-02 DIAGNOSIS — Z6831 Body mass index (BMI) 31.0-31.9, adult: Secondary | ICD-10-CM | POA: Insufficient documentation

## 2020-01-02 DIAGNOSIS — Z419 Encounter for procedure for purposes other than remedying health state, unspecified: Secondary | ICD-10-CM

## 2020-01-02 HISTORY — PX: KYPHOPLASTY: SHX5884

## 2020-01-02 SURGERY — KYPHOPLASTY
Anesthesia: Monitor Anesthesia Care

## 2020-01-02 MED ORDER — PROPOFOL 1000 MG/100ML IV EMUL
INTRAVENOUS | Status: AC
  Filled 2020-01-02: qty 100

## 2020-01-02 MED ORDER — OXYCODONE HCL 5 MG/5ML PO SOLN: 5.0000 mg | Freq: Once | ORAL | Status: DC | PRN

## 2020-01-02 MED ORDER — EPHEDRINE 5 MG/ML INJ
INTRAVENOUS | Status: AC
  Filled 2020-01-02: qty 10

## 2020-01-02 MED ORDER — ONDANSETRON HCL 4 MG/2ML IJ SOLN
INTRAMUSCULAR | Status: AC
  Filled 2020-01-02: qty 2

## 2020-01-02 MED ORDER — PROPOFOL 500 MG/50ML IV EMUL
INTRAVENOUS | Status: DC | PRN
  Administered 2020-01-02: 40 mg via INTRAVENOUS
  Administered 2020-01-02: 50 mg via INTRAVENOUS

## 2020-01-02 MED ORDER — ONDANSETRON HCL 4 MG/2ML IJ SOLN: 4.0000 mg | Freq: Once | INTRAMUSCULAR | Status: DC | PRN

## 2020-01-02 MED ORDER — 0.9 % SODIUM CHLORIDE (POUR BTL) OPTIME
TOPICAL | Status: DC | PRN
  Administered 2020-01-02: 1000 mL

## 2020-01-02 MED ORDER — DEXAMETHASONE SODIUM PHOSPHATE 10 MG/ML IJ SOLN
INTRAMUSCULAR | Status: AC
  Filled 2020-01-02: qty 1

## 2020-01-02 MED ORDER — ACETAMINOPHEN 10 MG/ML IV SOLN
INTRAVENOUS | Status: DC | PRN
  Administered 2020-01-02: 1000 mg via INTRAVENOUS

## 2020-01-02 MED ORDER — METHOCARBAMOL 1000 MG/10ML IJ SOLN
500.0000 mg | Freq: Four times a day (QID) | INTRAMUSCULAR | Status: DC | PRN
Start: 2020-01-02 — End: 2020-01-02

## 2020-01-02 MED ORDER — CEFAZOLIN SODIUM-DEXTROSE 2-4 GM/100ML-% IV SOLN
2.0000 g | INTRAVENOUS | Status: AC
  Administered 2020-01-02: 2 g via INTRAVENOUS

## 2020-01-02 MED ORDER — ORAL CARE MOUTH RINSE: 15.0000 mL | Freq: Once | OROMUCOSAL | Status: AC

## 2020-01-02 MED ORDER — FENTANYL CITRATE (PF) 250 MCG/5ML IJ SOLN
INTRAMUSCULAR | Status: AC
  Filled 2020-01-02: qty 5

## 2020-01-02 MED ORDER — ONDANSETRON HCL 4 MG PO TABS: 4.0000 mg | ORAL_TABLET | Freq: Three times a day (TID) | ORAL | 0 refills | Status: AC | PRN

## 2020-01-02 MED ORDER — PROPOFOL 10 MG/ML IV BOLUS
INTRAVENOUS | Status: AC
  Filled 2020-01-02: qty 20

## 2020-01-02 MED ORDER — PROPOFOL 10 MG/ML IV BOLUS
INTRAVENOUS | Status: DC | PRN
  Administered 2020-01-02: 25 ug/kg/min via INTRAVENOUS

## 2020-01-02 MED ORDER — THROMBIN 20000 UNITS EX KIT
PACK | CUTANEOUS | Status: AC
  Filled 2020-01-02: qty 1

## 2020-01-02 MED ORDER — BUPIVACAINE-EPINEPHRINE 0.25% -1:200000 IJ SOLN
INTRAMUSCULAR | Status: DC | PRN
  Administered 2020-01-02 (×2): 10 mL

## 2020-01-02 MED ORDER — MIDAZOLAM HCL 5 MG/5ML IJ SOLN
INTRAMUSCULAR | Status: DC | PRN
  Administered 2020-01-02: 2 mg via INTRAVENOUS

## 2020-01-02 MED ORDER — METHOCARBAMOL 500 MG PO TABS: 500.0000 mg | ORAL_TABLET | Freq: Four times a day (QID) | ORAL | Status: DC | PRN

## 2020-01-02 MED ORDER — BUPIVACAINE LIPOSOME 1.3 % IJ SUSP
INTRAMUSCULAR | Status: DC | PRN
  Administered 2020-01-02: 20 mL

## 2020-01-02 MED ORDER — CHLORHEXIDINE GLUCONATE 0.12 % MT SOLN: 15.0000 mL | Freq: Once | OROMUCOSAL | Status: AC

## 2020-01-02 MED ORDER — CEFAZOLIN SODIUM-DEXTROSE 2-4 GM/100ML-% IV SOLN
INTRAVENOUS | Status: AC
  Filled 2020-01-02: qty 100

## 2020-01-02 MED ORDER — EPHEDRINE SULFATE-NACL 50-0.9 MG/10ML-% IV SOSY
PREFILLED_SYRINGE | INTRAVENOUS | Status: DC | PRN
  Administered 2020-01-02 (×3): 5 mg via INTRAVENOUS

## 2020-01-02 MED ORDER — MIDAZOLAM HCL 2 MG/2ML IJ SOLN
INTRAMUSCULAR | Status: AC
  Filled 2020-01-02: qty 2

## 2020-01-02 MED ORDER — FENTANYL CITRATE (PF) 250 MCG/5ML IJ SOLN
INTRAMUSCULAR | Status: DC | PRN
  Administered 2020-01-02 (×3): 50 ug via INTRAVENOUS

## 2020-01-02 MED ORDER — FENTANYL CITRATE (PF) 100 MCG/2ML IJ SOLN
25.0000 ug | INTRAMUSCULAR | Status: DC | PRN
Start: 2020-01-02 — End: 2020-01-02

## 2020-01-02 MED ORDER — ACETAMINOPHEN 10 MG/ML IV SOLN
INTRAVENOUS | Status: AC
  Filled 2020-01-02: qty 100

## 2020-01-02 MED ORDER — CHLORHEXIDINE GLUCONATE 0.12 % MT SOLN
OROMUCOSAL | Status: AC
  Administered 2020-01-02: 15 mL via OROMUCOSAL
  Filled 2020-01-02: qty 15

## 2020-01-02 MED ORDER — LACTATED RINGERS IV SOLN: INTRAVENOUS | Status: DC

## 2020-01-02 MED ORDER — IOPAMIDOL (ISOVUE-300) INJECTION 61%
INTRAVENOUS | Status: DC | PRN
  Administered 2020-01-02: 50 mL

## 2020-01-02 MED ORDER — LIDOCAINE 2% (20 MG/ML) 5 ML SYRINGE
INTRAMUSCULAR | Status: AC
  Filled 2020-01-02: qty 5

## 2020-01-02 MED ORDER — OXYCODONE HCL 5 MG PO TABS: 5.0000 mg | ORAL_TABLET | Freq: Once | ORAL | Status: DC | PRN

## 2020-01-02 MED ORDER — BUPIVACAINE-EPINEPHRINE (PF) 0.25% -1:200000 IJ SOLN
INTRAMUSCULAR | Status: AC
  Filled 2020-01-02: qty 20

## 2020-01-02 MED ORDER — BUPIVACAINE LIPOSOME 1.3 % IJ SUSP
20.0000 mL | Freq: Once | INTRAMUSCULAR | Status: DC
  Filled 2020-01-02: qty 20

## 2020-01-02 MED ORDER — DEXAMETHASONE SODIUM PHOSPHATE 4 MG/ML IJ SOLN
INTRAMUSCULAR | Status: DC | PRN
Start: 2020-01-02 — End: 2020-01-02
  Administered 2020-01-02: 4 mg via INTRAVENOUS

## 2020-01-02 MED ORDER — LACTATED RINGERS IV SOLN: INTRAVENOUS | Status: DC | PRN

## 2020-01-02 MED ORDER — ONDANSETRON HCL 4 MG/2ML IJ SOLN
INTRAMUSCULAR | Status: DC | PRN
  Administered 2020-01-02: 4 mg via INTRAVENOUS

## 2020-01-02 SURGICAL SUPPLY — 42 items
BLADE SURG 15 STRL LF DISP TIS (BLADE) ×1 IMPLANT
BLADE SURG 15 STRL SS (BLADE) ×2
BNDG ADH 1X3 SHEER STRL LF (GAUZE/BANDAGES/DRESSINGS) ×6 IMPLANT
CEMENT KYPHON CX01A KIT/MIXER (Cement) ×3 IMPLANT
COVER MAYO STAND STRL (DRAPES) ×3 IMPLANT
COVER SURGICAL LIGHT HANDLE (MISCELLANEOUS) ×3 IMPLANT
COVER WAND RF STERILE (DRAPES) IMPLANT
CURETTE EXPRESS SZ2 7MM (INSTRUMENTS) ×1 IMPLANT
CURETTE WEDGE 8.5MM KYPHX (MISCELLANEOUS) IMPLANT
CURRETTE EXPRESS SZ2 7MM (INSTRUMENTS) ×3
DERMABOND ADVANCED (GAUZE/BANDAGES/DRESSINGS) ×2
DERMABOND ADVANCED .7 DNX12 (GAUZE/BANDAGES/DRESSINGS) ×1 IMPLANT
DRAPE C-ARM 42X72 X-RAY (DRAPES) ×6 IMPLANT
DRAPE INCISE IOBAN 66X45 STRL (DRAPES) ×3 IMPLANT
DRAPE LAPAROTOMY T 102X78X121 (DRAPES) ×3 IMPLANT
DRAPE WARM FLUID 44X44 (DRAPES) ×3 IMPLANT
DURAPREP 26ML APPLICATOR (WOUND CARE) ×3 IMPLANT
GLOVE BIO SURGEON STRL SZ 6.5 (GLOVE) ×2 IMPLANT
GLOVE BIO SURGEONS STRL SZ 6.5 (GLOVE) ×1
GLOVE BIOGEL PI IND STRL 6.5 (GLOVE) ×1 IMPLANT
GLOVE BIOGEL PI IND STRL 8.5 (GLOVE) IMPLANT
GLOVE BIOGEL PI INDICATOR 6.5 (GLOVE) ×2
GLOVE BIOGEL PI INDICATOR 8.5 (GLOVE)
GLOVE SS BIOGEL STRL SZ 8.5 (GLOVE) ×1 IMPLANT
GLOVE SUPERSENSE BIOGEL SZ 8.5 (GLOVE) ×2
GOWN STRL REUS W/ TWL LRG LVL3 (GOWN DISPOSABLE) ×2 IMPLANT
GOWN STRL REUS W/TWL 2XL LVL3 (GOWN DISPOSABLE) ×3 IMPLANT
GOWN STRL REUS W/TWL LRG LVL3 (GOWN DISPOSABLE) ×4
KIT BASIN OR (CUSTOM PROCEDURE TRAY) ×3 IMPLANT
KIT TURNOVER KIT B (KITS) ×3 IMPLANT
NEEDLE HYPO 22GX1.5 SAFETY (NEEDLE) ×3 IMPLANT
NEEDLE SPNL 22GX3.5 QUINCKE BK (NEEDLE) ×3 IMPLANT
NS IRRIG 1000ML POUR BTL (IV SOLUTION) ×3 IMPLANT
PACK SURGICAL SETUP 50X90 (CUSTOM PROCEDURE TRAY) ×3 IMPLANT
PAD ARMBOARD 7.5X6 YLW CONV (MISCELLANEOUS) ×6 IMPLANT
SPONGE LAP 4X18 RFD (DISPOSABLE) ×3 IMPLANT
SUT MNCRL AB 3-0 PS2 18 (SUTURE) ×3 IMPLANT
SYR CONTROL 10ML LL (SYRINGE) ×3 IMPLANT
TOWEL GREEN STERILE (TOWEL DISPOSABLE) ×3 IMPLANT
TRAY KYPHOPAK 15/3 ONESTEP 1ST (MISCELLANEOUS) ×3 IMPLANT
TRAY KYPHOPAK 20/3 ONESTEP 1ST (MISCELLANEOUS) IMPLANT
WATER STERILE IRR 1000ML POUR (IV SOLUTION) ×3 IMPLANT

## 2020-01-02 NOTE — Transfer of Care (Signed)
Immediate Anesthesia Transfer of Care Note  Patient: Logan Roth  Procedure(s) Performed: KYPHOPLASTY L1 (N/A )  Patient Location: PACU  Anesthesia Type:MAC  Level of Consciousness: awake  Airway & Oxygen Therapy: Patient Spontanous Breathing  Post-op Assessment: Report given to RN, Post -op Vital signs reviewed and stable and Patient moving all extremities X 4  Post vital signs: Reviewed and stable  Last Vitals:  Vitals Value Taken Time  BP    Temp    Pulse    Resp    SpO2      Last Pain:  Vitals:   01/02/20 0630  TempSrc:   PainSc: 8       Patients Stated Pain Goal: 6 (01/74/94 4967)  Complications: No complications documented.

## 2020-01-02 NOTE — Op Note (Signed)
Operative report  Preoperative diagnosis L1 compression fracture  Postoperative diagnosis: Same  Operative procedure: L1 kyphoplasty  Complications: None  Indications: Logan Roth is a very pleasant 64 year old gentleman who has chronic debilitating back pain for which he is in pain medical management.  He presented with acute worsening of his back pain and was diagnosed with a L1 compression fracture.  It should be noted that he does have transitional anatomy and based on the last full disc space being L5-S1 the fractures at L1.  However his last rib-bearing vertebrae being T12 if the count is started there this is an L2 fracture.  As result of the increased pain we elected to move forward with an L1 kyphoplasty to address the acute worsening of his chronic pain.  All risks benefits and alternatives to surgery were discussed with the patient and consent was obtained  Operative report patient was brought the operating room turned prone onto the operating room table.  Once he was comfortable IV sedation was provided by the CRNA and the back was prepped and draped in a standard fashion.  A timeout was taken to confirm patient procedure and all other important data.  Using fluoroscopy identified the lateral borders of the L1 pedicle were marked in the area anesthetized with quarter percent Marcaine with epinephrine and Exparel.  A spinal needle was then used advanced down to the periosteum and this was anesthetized as well.  Once adequate local anesthesia was obtained I made a small incision and advanced the Jamshidi needle down to the lateral aspect of the L1 pedicle.  I confirmed satisfactory position in both the AP and lateral plane and then advanced the Jamshidi needle into the L1 pedicle.  I confirmed trajectory with fluoroscopy.  As I neared the medial wall of the pedicle on the AP view I confirmed that I was just beyond the posterior wall of the vertebral body on the lateral view.  Once I was in the  vertebral body I then placed the drill and then sounded the canal to ensure had a solid bony canal.  I then placed the curette and then the inflatable bone tamps.  Once had an adequate fill with the bone tamps I removed them and then inserted the cement.  I monitored the cement insertion with biplane fluoroscopy views.  A total of approximately 7 cc of cement were inserted.  Once it was hardened I remove the Jamshidi needle.  At this point final x-rays demonstrate satisfactory fill of the vertebral body with the cement, with no leak.  The skin was cleaned and the stab incisions were closed with a simple 3-0 Monocryl stitch.  Dermabond and a Band-Aid were applied and the patient was transferred back to the PACU without incident.  The end of the case all needle sponge counts were correct.  There were no adverse intraoperative events.

## 2020-01-02 NOTE — Anesthesia Postprocedure Evaluation (Signed)
Anesthesia Post Note  Patient: Logan Roth  Procedure(s) Performed: KYPHOPLASTY L1 (N/A )     Patient location during evaluation: PACU Anesthesia Type: MAC Level of consciousness: awake and alert Pain management: pain level controlled Vital Signs Assessment: post-procedure vital signs reviewed and stable Respiratory status: spontaneous breathing, nonlabored ventilation and respiratory function stable Cardiovascular status: stable and blood pressure returned to baseline Anesthetic complications: no   No complications documented.  Last Vitals:  Vitals:   01/02/20 0851 01/02/20 0853  BP: 110/76   Pulse: 63 63  Resp: 16 12  Temp:    SpO2: 95% 94%    Last Pain:  Vitals:   01/02/20 0841  TempSrc:   PainSc: 0-No pain                 Audry Pili

## 2020-01-02 NOTE — Anesthesia Procedure Notes (Signed)
Procedure Name: MAC Performed by: Michele Rockers, CRNA Pre-anesthesia Checklist: Patient identified, Emergency Drugs available, Suction available, Timeout performed and Patient being monitored Patient Re-evaluated:Patient Re-evaluated prior to induction Oxygen Delivery Method: Nasal Cannula

## 2020-01-02 NOTE — Brief Op Note (Signed)
01/02/2020  8:43 AM  PATIENT:  Logan Roth  64 y.o. male  PRE-OPERATIVE DIAGNOSIS:  L1 compression fracture  POST-OPERATIVE DIAGNOSIS:  L1 compression fracture  PROCEDURE:  Procedure(s) with comments: KYPHOPLASTY L1 (N/A) - 60 mins  SURGEON:  Surgeon(s) and Role:    Venita Lick, MD - Primary  PHYSICIAN ASSISTANT:   ASSISTANTS: none   ANESTHESIA:   local and IV sedation  EBL:  5 mL   BLOOD ADMINISTERED:none  DRAINS: none   LOCAL MEDICATIONS USED:  MARCAINE    and OTHER exparel  SPECIMEN:  No Specimen  DISPOSITION OF SPECIMEN:  N/A  COUNTS:  YES  TOURNIQUET:  * No tourniquets in log *  DICTATION: .Dragon Dictation  PLAN OF CARE: Discharge to home after PACU  PATIENT DISPOSITION:  PACU - hemodynamically stable.

## 2020-01-02 NOTE — H&P (Signed)
Addendum H&P  Patient presents with ongoing low back pain.  Imaging studies confirmed a L1 compression fracture.  He has had some slight increase in his baseline back pain and there was concern raised over possible adjacent L2 fracture.  I did inform the patient that if the L2 vertebral body appears to have compressed compared to his preop x-rays that we will move forward and address that as well.  Other than increased pain there has been no change to his clinical exam since his last office visit of 12/31/2019.  Plan is to move forward with a kyphoplasty of L1 and possibly 2 if it is also broken.  All appropriate risks benefits and alternatives were discussed with the patient and consent was signed.

## 2020-01-03 ENCOUNTER — Encounter (HOSPITAL_COMMUNITY): Payer: Self-pay | Admitting: Orthopedic Surgery

## 2020-02-06 ENCOUNTER — Telehealth: Payer: Self-pay | Admitting: General Practice

## 2020-02-06 NOTE — Telephone Encounter (Signed)
Logan Roth is calling stating he is having a hard time getting his results from his Lexiscan performed on 12/13/19 from his Provider that requested it. He is requesting he receive a hard copy of the results from our office. Please advise.

## 2021-09-08 DIAGNOSIS — M545 Low back pain, unspecified: Secondary | ICD-10-CM | POA: Diagnosis not present

## 2021-09-08 DIAGNOSIS — Z79899 Other long term (current) drug therapy: Secondary | ICD-10-CM | POA: Diagnosis not present

## 2021-09-08 DIAGNOSIS — G8929 Other chronic pain: Secondary | ICD-10-CM | POA: Diagnosis not present

## 2021-09-08 DIAGNOSIS — R030 Elevated blood-pressure reading, without diagnosis of hypertension: Secondary | ICD-10-CM | POA: Diagnosis not present

## 2021-09-08 DIAGNOSIS — G4700 Insomnia, unspecified: Secondary | ICD-10-CM | POA: Diagnosis not present

## 2021-09-09 DIAGNOSIS — J449 Chronic obstructive pulmonary disease, unspecified: Secondary | ICD-10-CM | POA: Diagnosis not present

## 2021-09-09 DIAGNOSIS — I10 Essential (primary) hypertension: Secondary | ICD-10-CM | POA: Diagnosis not present

## 2021-09-09 DIAGNOSIS — R911 Solitary pulmonary nodule: Secondary | ICD-10-CM | POA: Diagnosis not present

## 2021-09-13 DIAGNOSIS — Z79899 Other long term (current) drug therapy: Secondary | ICD-10-CM | POA: Diagnosis not present

## 2021-10-05 DIAGNOSIS — G8929 Other chronic pain: Secondary | ICD-10-CM | POA: Diagnosis not present

## 2021-10-05 DIAGNOSIS — G4700 Insomnia, unspecified: Secondary | ICD-10-CM | POA: Diagnosis not present

## 2021-10-05 DIAGNOSIS — Z79899 Other long term (current) drug therapy: Secondary | ICD-10-CM | POA: Diagnosis not present

## 2021-10-05 DIAGNOSIS — M545 Low back pain, unspecified: Secondary | ICD-10-CM | POA: Diagnosis not present

## 2021-10-07 DIAGNOSIS — Z79899 Other long term (current) drug therapy: Secondary | ICD-10-CM | POA: Diagnosis not present

## 2021-11-08 DIAGNOSIS — G4700 Insomnia, unspecified: Secondary | ICD-10-CM | POA: Diagnosis not present

## 2021-11-08 DIAGNOSIS — G8929 Other chronic pain: Secondary | ICD-10-CM | POA: Diagnosis not present

## 2021-11-08 DIAGNOSIS — R030 Elevated blood-pressure reading, without diagnosis of hypertension: Secondary | ICD-10-CM | POA: Diagnosis not present

## 2021-11-08 DIAGNOSIS — Z79899 Other long term (current) drug therapy: Secondary | ICD-10-CM | POA: Diagnosis not present

## 2021-11-08 DIAGNOSIS — M545 Low back pain, unspecified: Secondary | ICD-10-CM | POA: Diagnosis not present

## 2021-11-11 DIAGNOSIS — Z79899 Other long term (current) drug therapy: Secondary | ICD-10-CM | POA: Diagnosis not present

## 2021-12-02 DIAGNOSIS — R072 Precordial pain: Secondary | ICD-10-CM | POA: Diagnosis not present

## 2021-12-02 DIAGNOSIS — I2510 Atherosclerotic heart disease of native coronary artery without angina pectoris: Secondary | ICD-10-CM | POA: Diagnosis not present

## 2021-12-02 DIAGNOSIS — I10 Essential (primary) hypertension: Secondary | ICD-10-CM | POA: Diagnosis not present

## 2021-12-02 DIAGNOSIS — E7800 Pure hypercholesterolemia, unspecified: Secondary | ICD-10-CM | POA: Diagnosis not present

## 2021-12-07 DIAGNOSIS — G8929 Other chronic pain: Secondary | ICD-10-CM | POA: Diagnosis not present

## 2021-12-07 DIAGNOSIS — I10 Essential (primary) hypertension: Secondary | ICD-10-CM | POA: Diagnosis not present

## 2021-12-07 DIAGNOSIS — R030 Elevated blood-pressure reading, without diagnosis of hypertension: Secondary | ICD-10-CM | POA: Diagnosis not present

## 2021-12-07 DIAGNOSIS — M545 Low back pain, unspecified: Secondary | ICD-10-CM | POA: Diagnosis not present

## 2021-12-07 DIAGNOSIS — E7800 Pure hypercholesterolemia, unspecified: Secondary | ICD-10-CM | POA: Diagnosis not present

## 2021-12-07 DIAGNOSIS — Z79899 Other long term (current) drug therapy: Secondary | ICD-10-CM | POA: Diagnosis not present

## 2021-12-11 DIAGNOSIS — Z79899 Other long term (current) drug therapy: Secondary | ICD-10-CM | POA: Diagnosis not present

## 2021-12-13 DIAGNOSIS — I77810 Thoracic aortic ectasia: Secondary | ICD-10-CM | POA: Diagnosis not present

## 2021-12-13 DIAGNOSIS — R072 Precordial pain: Secondary | ICD-10-CM | POA: Diagnosis not present

## 2021-12-13 DIAGNOSIS — I2510 Atherosclerotic heart disease of native coronary artery without angina pectoris: Secondary | ICD-10-CM | POA: Diagnosis not present

## 2021-12-13 HISTORY — PX: NM MYOVIEW LTD: HXRAD82

## 2021-12-17 DIAGNOSIS — M545 Low back pain, unspecified: Secondary | ICD-10-CM | POA: Diagnosis not present

## 2021-12-17 DIAGNOSIS — Z79899 Other long term (current) drug therapy: Secondary | ICD-10-CM | POA: Diagnosis not present

## 2021-12-17 DIAGNOSIS — I10 Essential (primary) hypertension: Secondary | ICD-10-CM | POA: Diagnosis not present

## 2021-12-17 DIAGNOSIS — G4700 Insomnia, unspecified: Secondary | ICD-10-CM | POA: Diagnosis not present

## 2021-12-23 DIAGNOSIS — Z79899 Other long term (current) drug therapy: Secondary | ICD-10-CM | POA: Diagnosis not present

## 2021-12-24 DIAGNOSIS — J449 Chronic obstructive pulmonary disease, unspecified: Secondary | ICD-10-CM | POA: Diagnosis not present

## 2021-12-24 DIAGNOSIS — Z0000 Encounter for general adult medical examination without abnormal findings: Secondary | ICD-10-CM | POA: Diagnosis not present

## 2021-12-24 DIAGNOSIS — R911 Solitary pulmonary nodule: Secondary | ICD-10-CM | POA: Diagnosis not present

## 2021-12-24 DIAGNOSIS — I10 Essential (primary) hypertension: Secondary | ICD-10-CM | POA: Diagnosis not present

## 2021-12-24 DIAGNOSIS — Z23 Encounter for immunization: Secondary | ICD-10-CM | POA: Diagnosis not present

## 2021-12-24 DIAGNOSIS — M542 Cervicalgia: Secondary | ICD-10-CM | POA: Diagnosis not present

## 2022-01-05 ENCOUNTER — Encounter: Payer: Self-pay | Admitting: Cardiology

## 2022-01-05 DIAGNOSIS — I10 Essential (primary) hypertension: Secondary | ICD-10-CM | POA: Insufficient documentation

## 2022-01-05 DIAGNOSIS — R9430 Abnormal result of cardiovascular function study, unspecified: Secondary | ICD-10-CM | POA: Diagnosis not present

## 2022-01-05 DIAGNOSIS — E7800 Pure hypercholesterolemia, unspecified: Secondary | ICD-10-CM | POA: Diagnosis not present

## 2022-01-05 DIAGNOSIS — E785 Hyperlipidemia, unspecified: Secondary | ICD-10-CM | POA: Insufficient documentation

## 2022-01-05 DIAGNOSIS — R9439 Abnormal result of other cardiovascular function study: Secondary | ICD-10-CM | POA: Insufficient documentation
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
## 2022-01-06 ENCOUNTER — Ambulatory Visit (INDEPENDENT_AMBULATORY_CARE_PROVIDER_SITE_OTHER): Payer: Medicare Other | Admitting: Cardiology

## 2022-01-06 ENCOUNTER — Encounter: Payer: Self-pay | Admitting: Cardiology

## 2022-01-06 ENCOUNTER — Other Ambulatory Visit
Admission: RE | Admit: 2022-01-06 | Discharge: 2022-01-06 | Disposition: A | Discharge status: Home / Self Care | Payer: Medicare Other | Source: Ambulatory Visit | Attending: Cardiology | Admitting: Cardiology

## 2022-01-06 VITALS — BP 125/83 | HR 75 | Ht 68.0 in | Wt 230.4 lb

## 2022-01-06 DIAGNOSIS — J449 Chronic obstructive pulmonary disease, unspecified: Secondary | ICD-10-CM

## 2022-01-06 DIAGNOSIS — I200 Unstable angina: Secondary | ICD-10-CM

## 2022-01-06 DIAGNOSIS — E782 Mixed hyperlipidemia: Secondary | ICD-10-CM

## 2022-01-06 DIAGNOSIS — F17210 Nicotine dependence, cigarettes, uncomplicated: Secondary | ICD-10-CM

## 2022-01-06 DIAGNOSIS — E785 Hyperlipidemia, unspecified: Secondary | ICD-10-CM | POA: Diagnosis not present

## 2022-01-06 DIAGNOSIS — I10 Essential (primary) hypertension: Secondary | ICD-10-CM | POA: Insufficient documentation

## 2022-01-06 DIAGNOSIS — R9439 Abnormal result of other cardiovascular function study: Secondary | ICD-10-CM | POA: Diagnosis not present

## 2022-01-06 LAB — COMPREHENSIVE METABOLIC PANEL
ALT: 26 U/L (ref 0–44)
AST: 23 U/L (ref 15–41)
Albumin: 4.3 g/dL (ref 3.5–5.0)
Alkaline Phosphatase: 78 U/L (ref 38–126)
Anion gap: 7 (ref 5–15)
BUN: 17 mg/dL (ref 8–23)
CO2: 26 mmol/L (ref 22–32)
Calcium: 9.1 mg/dL (ref 8.9–10.3)
Chloride: 105 mmol/L (ref 98–111)
Creatinine, Ser: 1.2 mg/dL (ref 0.61–1.24)
GFR, Estimated: >60 mL/min (ref >60)
Glucose, Bld: 108 mg/dL — ABNORMAL HIGH (ref 70–99)
Potassium: 5 mmol/L (ref 3.5–5.1)
Sodium: 138 mmol/L (ref 135–145)
Total Bilirubin: 0.6 mg/dL (ref 0.3–1.2)
Total Protein: 7.8 g/dL (ref 6.5–8.1)

## 2022-01-06 LAB — CBC
HCT: 42.4 % (ref 39.0–52.0)
Hemoglobin: 13.8 g/dL (ref 13.0–17.0)
MCH: 28 pg (ref 26.0–34.0)
MCHC: 32.5 g/dL (ref 30.0–36.0)
MCV: 86 fL (ref 80.0–100.0)
Platelets: 303 10*3/uL (ref 150–400)
RBC: 4.93 MIL/uL (ref 4.22–5.81)
RDW: 14.9 % (ref 11.5–15.5)
WBC: 8 10*3/uL (ref 4.0–10.5)
nRBC: 0 % (ref 0.0–0.2)

## 2022-01-06 MED ORDER — SODIUM CHLORIDE 0.9% FLUSH: 3.0000 mL | Freq: Two times a day (BID) | INTRAVENOUS | Status: DC

## 2022-01-06 NOTE — Assessment & Plan Note (Signed)
[name] had switched him from amlodipine to atenolol, but also gave him prescription for Imdur which he was not able to pick up because it was not available, and chose not to pick up sublingual NTG.

## 2022-01-06 NOTE — Assessment & Plan Note (Signed)
Blood pressure is pretty well controlled today.  He was switched from amlodipine 10 mg to atenolol 50 mg 2 tablet daily

## 2022-01-06 NOTE — H&P (View-Only) (Signed)
 Brief Precath Note   Subjective:     Subjective []Expand by Default Logan Roth is a 66 y.o. male with a history of COPD, hypercholesterolemia, hypertension, and tobacco use. He was seen in Clinic Consultation at the request of Dr. John Cahill ([facility]) with complaints of dyspnea, exertional chest pressure/discomfort, irregular heart beat, and Abnormal Nuclear ST . Cardiac markers were not checked. EKG showed no prior ECG.   Prior Procedures/Dates: PTCA: no CABG: no Exercise treadmill test/Stress: yes  Lexiscan Myoview - Inferior Ischemia Echo: no Ischemia? yes  Reversible Inferior Defect -- Intermediate Risk. Other significant clinical information: Dye Allergy? no Coumadin? no Metformin? no Creatinine >1.5 no   The following portions of the patient's history were reviewed and updated as appropriate: allergies, current medications, past family history, past medical history, past social history, past surgical history, and problem list.   Review of Systems Pertinent items noted in HPI and remainder of comprehensive ROS otherwise negative.     Objective:      Objective []Expand by Default Physical Exam BP 125/83   Pulse 75   Ht 5' 8" (1.727 m)   Wt 230 lb 6.4 oz (104.5 kg)   SpO2 93%   BMI 35.03 kg/m    General Appearance:    Alert, cooperative, no distress, appears stated age; smells of cigarrette smoke  Head:    Normocephalic, without obvious abnormality, atraumatic; Edentulous  Eyes:    PERRL, conjunctiva/corneas clear, EOM's intact,                 Neck:   Supple, symmetrical, trachea midline, no adenopathy;       thyroid:  No enlargement/tenderness/nodules; no carotid   bruit or JVD       Lungs:     Clear to auscultation bilaterally, respirations unlabored  Chest wall:    No tenderness or deformity  Heart:    Regular rate and rhythm, S1 and S2 normal, no murmur, rub   or gallop  Abdomen:     Soft, non-tender, bowel sounds active all four  quadrants,    no masses, no organomegaly            Extremities:   Extremities normal, atraumatic, no cyanosis or edema  Pulses:   2+ and symmetric all extremities  Skin:   Skin color, texture, turgor normal, no rashes or lesions       Neurologic:   CNII-XII intact. Normal strength, sensation and reflexes      throughout      Cardiographics ECG: no prior ECG . Echocardiogram: not done   Imaging Chest x-ray:  not done     Lab Review  not applicable Ordered today     Assessment:      Assessment Logan Roth presents with the following Indications for CARDIAC CATHETERIZATION procedure: abnormal stress test, exertional angina, shortness of breath, Long Term Smoker, HTN, HLD     Plan:      Plan Cardiac cath to rule out ischemic CAD. Possible angioplasty. The procedure and risks described to patient including risk of CVA, MI, bleeding, emergency surgery, death,  . Consent signed.   Shared Decision Making/Informed Consent The risks [stroke (1 in 1000), death (1 in 1000), kidney failure [usually temporary] (1 in 500), bleeding (1 in 200), allergic reaction [possibly serious] (1 in 200)], benefits (diagnostic support and management of coronary artery disease) and alternatives of a cardiac catheterization were discussed in detail with Mr. Knappenberger and he is willing to proceed.       Renal: Hydration with NS PER PROTOCOL       [name], MD

## 2022-01-06 NOTE — Assessment & Plan Note (Signed)
On pravastatin 80 mg, with plan to convert to high intensity statin-atorvastatin 80 mg

## 2022-01-06 NOTE — Progress Notes (Signed)
Brief Precath Note   Subjective:     Subjective [] Expand by Default Logan Roth is a 66 y.o. male with a history of COPD, hypercholesterolemia, hypertension, and tobacco use. He was seen in Clinic Consultation at the request of Dr. 71 [facility]) with complaints of dyspnea, exertional chest pressure/discomfort, irregular heart beat, and Abnormal Nuclear ST . Cardiac markers were not checked. EKG showed no prior ECG.   Prior Procedures/Dates: PTCA: no CABG: no Exercise treadmill test/Stress: yes  Lexiscan Myoview - Inferior Ischemia Echo: no Ischemia? yes  Reversible Inferior Defect -- Intermediate Risk. Other significant clinical information: Dye Allergy? no Coumadin? no Metformin? no Creatinine >1.5 no   The following portions of the patient's history were reviewed and updated as appropriate: allergies, current medications, past family history, past medical history, past social history, past surgical history, and problem list.   Review of Systems Pertinent items noted in HPI and remainder of comprehensive ROS otherwise negative.     Objective:      Objective [] Expand by Default Physical Exam BP 125/83   Pulse 75   Ht 5\' 8"  (1.727 m)   Wt 230 lb 6.4 oz (104.5 kg)   SpO2 93%   BMI 35.03 kg/m    General Appearance:    Alert, cooperative, no distress, appears stated age; smells of cigarrette smoke  Head:    Normocephalic, without obvious abnormality, atraumatic; Edentulous  Eyes:    PERRL, conjunctiva/corneas clear, EOM's intact,                 Neck:   Supple, symmetrical, trachea midline, no adenopathy;       thyroid:  No enlargement/tenderness/nodules; no carotid   bruit or JVD       Lungs:     Clear to auscultation bilaterally, respirations unlabored  Chest wall:    No tenderness or deformity  Heart:    Regular rate and rhythm, S1 and S2 normal, no murmur, rub   or gallop  Abdomen:     Soft, non-tender, bowel sounds active all four  quadrants,    no masses, no organomegaly            Extremities:   Extremities normal, atraumatic, no cyanosis or edema  Pulses:   2+ and symmetric all extremities  Skin:   Skin color, texture, turgor normal, no rashes or lesions       Neurologic:   CNII-XII intact. Normal strength, sensation and reflexes      throughout      Cardiographics ECG: no prior ECG . Echocardiogram: not done   Imaging Chest x-ray:  not done     Lab Review  not applicable Ordered today     Assessment:      Assessment Reyn Faivre presents with the following Indications for CARDIAC CATHETERIZATION procedure: abnormal stress test, exertional angina, shortness of breath, Long Term Smoker, HTN, HLD     Plan:      Plan Cardiac cath to rule out ischemic CAD. Possible angioplasty. The procedure and risks described to patient including risk of CVA, MI, bleeding, emergency surgery, death,  . Consent signed.   Shared Decision Making/Informed Consent The risks [stroke (1 in 1000), death (1 in 1000), kidney failure [usually temporary] (1 in 500), bleeding (1 in 200), allergic reaction [possibly serious] (1 in 200)], benefits (diagnostic support and management of coronary artery disease) and alternatives of a cardiac catheterization were discussed in detail with Mr. Davenport and he is willing to proceed.  Renal: Hydration with NS PER PROTOCOL       [name], MD

## 2022-01-06 NOTE — Assessment & Plan Note (Signed)
Plan Cardiac cath to rule out ischemic CAD. Possible angioplasty. The procedure and risks described to patient including risk of CVA, MI, bleeding, emergency surgery, death,     Shared Decision Making/Informed Consent The risks [stroke (1 in 1000), death (1 in 1000), kidney failure [usually temporary] (1 in 500), bleeding (1 in 200), allergic reaction [possibly serious] (1 in 200)], benefits (diagnostic support and management of coronary artery disease) and alternatives of a cardiac catheterization were discussed in detail with Logan Roth and he is willing to proceed.     Renal: Hydration with NS PER PROTOCOL

## 2022-01-06 NOTE — Patient Instructions (Signed)
Medication Instructions:   Your physician recommends that you continue on your current medications as directed. Please refer to the Current Medication list given to you today.  *If you need a refill on your cardiac medications before your next appointment, please call your pharmacy*   Lab Work:  Today at the medical mall at [facility]: CBC, BMET  -  Please go to the Medical Mall Entrance at [facility] -  Check in at the Registration Desk: 1st desk to the right, past the screening table  If you have labs (blood work) drawn today and your tests are completely normal, you will receive your results only by: MyChart Message (if you have MyChart) OR A paper copy in the mail If you have any lab test that is abnormal or we need to change your treatment, we will call you to review the results.   Testing/Procedures:  You are scheduled for a Cardiac Catheterization on Friday, July 14 with Dr. [name].  1. Please arrive at the Main Entrance A at [facility]: [address] [place], [place] [address] at 11:30 AM (This time is two hours before your procedure to ensure your preparation). Free valet parking service is available.   Special note: Every effort is made to have your procedure done on time. Please understand that emergencies sometimes delay scheduled procedures.  2. Diet: Do not eat solid foods after midnight.  You may have clear liquids until 5 AM upon the day of the procedure.  3. Labs: Today at the medical mall: CBC, BMET  4. Medication instructions in preparation for your procedure:   On the morning of your procedure, take regular morning medicines.  You may use sips of water.  5. Plan to go home the same day, you will only stay overnight if medically necessary. 6. You MUST have a responsible adult to drive you home. 7. An adult MUST be with you the first 24 hours after you arrive home. 8. Bring a current list of your medications, and the last time and date medication  taken. 9. Bring ID and current insurance cards. 10.Please wear clothes that are easy to get on and off and wear slip-on shoes.  Thank you for allowing [place] to care for you!   -- [facility]    Follow-Up: At [facility], you and your health needs are our priority.  As part of our continuing mission to provide you with exceptional heart care, we have created designated Provider Care Teams.  These Care Teams include your primary Cardiologist (physician) and Advanced Practice Providers (APPs -  Physician Assistants and Nurse Practitioners) who all work together to provide you with the care you need, when you need it.  We recommend signing up for the patient portal called "MyChart".  Sign up information is provided on this After Visit Summary.  MyChart is used to connect with patients for Virtual Visits (Telemedicine).  Patients are able to view lab/test results, encounter notes, upcoming appointments, etc.  Non-urgent messages can be sent to your provider as well.   To learn more about what you can do with MyChart, go to [email or web address].    Your next appointment:    Follow up after cath procedure  The format for your next appointment:   In Person  Provider:   You may see Dr. [name] or one of the following Advanced Practice Providers on your designated Care Team:   Nicolasa Ducking, NP Eula Listen, PA-C [name], [place]  Important Information About Sugar

## 2022-01-07 ENCOUNTER — Ambulatory Visit (HOSPITAL_COMMUNITY)
Admission: RE | Admit: 2022-01-07 | Discharge: 2022-01-08 | Disposition: A | Discharge status: Home / Self Care | Payer: Medicare Other | Attending: Cardiology | Admitting: Cardiology

## 2022-01-07 ENCOUNTER — Encounter (HOSPITAL_COMMUNITY)
Admission: RE | Disposition: A | Discharge status: Home / Self Care | Payer: Self-pay | Source: Home / Self Care | Attending: Cardiology

## 2022-01-07 ENCOUNTER — Other Ambulatory Visit: Payer: Self-pay

## 2022-01-07 DIAGNOSIS — Z79899 Other long term (current) drug therapy: Secondary | ICD-10-CM | POA: Diagnosis not present

## 2022-01-07 DIAGNOSIS — I2510 Atherosclerotic heart disease of native coronary artery without angina pectoris: Secondary | ICD-10-CM

## 2022-01-07 DIAGNOSIS — E7800 Pure hypercholesterolemia, unspecified: Secondary | ICD-10-CM | POA: Diagnosis not present

## 2022-01-07 DIAGNOSIS — J449 Chronic obstructive pulmonary disease, unspecified: Secondary | ICD-10-CM | POA: Diagnosis not present

## 2022-01-07 DIAGNOSIS — E785 Hyperlipidemia, unspecified: Secondary | ICD-10-CM | POA: Diagnosis present

## 2022-01-07 DIAGNOSIS — Z7982 Long term (current) use of aspirin: Secondary | ICD-10-CM | POA: Diagnosis not present

## 2022-01-07 DIAGNOSIS — I200 Unstable angina: Secondary | ICD-10-CM

## 2022-01-07 DIAGNOSIS — Z7902 Long term (current) use of antithrombotics/antiplatelets: Secondary | ICD-10-CM | POA: Diagnosis not present

## 2022-01-07 DIAGNOSIS — Z955 Presence of coronary angioplasty implant and graft: Secondary | ICD-10-CM | POA: Insufficient documentation

## 2022-01-07 DIAGNOSIS — I10 Essential (primary) hypertension: Secondary | ICD-10-CM | POA: Diagnosis not present

## 2022-01-07 DIAGNOSIS — G8929 Other chronic pain: Secondary | ICD-10-CM | POA: Diagnosis not present

## 2022-01-07 DIAGNOSIS — I2582 Chronic total occlusion of coronary artery: Secondary | ICD-10-CM | POA: Insufficient documentation

## 2022-01-07 DIAGNOSIS — I25119 Atherosclerotic heart disease of native coronary artery with unspecified angina pectoris: Secondary | ICD-10-CM | POA: Insufficient documentation

## 2022-01-07 DIAGNOSIS — K5090 Crohn's disease, unspecified, without complications: Secondary | ICD-10-CM | POA: Insufficient documentation

## 2022-01-07 DIAGNOSIS — R9439 Abnormal result of other cardiovascular function study: Secondary | ICD-10-CM

## 2022-01-07 DIAGNOSIS — F319 Bipolar disorder, unspecified: Secondary | ICD-10-CM | POA: Insufficient documentation

## 2022-01-07 DIAGNOSIS — I25110 Atherosclerotic heart disease of native coronary artery with unstable angina pectoris: Secondary | ICD-10-CM | POA: Insufficient documentation

## 2022-01-07 DIAGNOSIS — K219 Gastro-esophageal reflux disease without esophagitis: Secondary | ICD-10-CM | POA: Diagnosis not present

## 2022-01-07 HISTORY — PX: LEFT HEART CATH AND CORONARY ANGIOGRAPHY: CATH118249

## 2022-01-07 HISTORY — PX: CORONARY STENT INTERVENTION: CATH118234

## 2022-01-07 HISTORY — DX: Atherosclerotic heart disease of native coronary artery without angina pectoris: I25.10

## 2022-01-07 LAB — POCT ACTIVATED CLOTTING TIME
Activated Clotting Time: 245 seconds
Activated Clotting Time: 383 seconds

## 2022-01-07 SURGERY — LEFT HEART CATH AND CORONARY ANGIOGRAPHY
Anesthesia: LOCAL

## 2022-01-07 MED ORDER — ALBUTEROL SULFATE (2.5 MG/3ML) 0.083% IN NEBU: 2.5000 mg | INHALATION_SOLUTION | Freq: Four times a day (QID) | RESPIRATORY_TRACT | Status: DC | PRN

## 2022-01-07 MED ORDER — PANTOPRAZOLE SODIUM 40 MG PO TBEC
40.0000 mg | DELAYED_RELEASE_TABLET | Freq: Every day | ORAL | Status: DC
  Administered 2022-01-07 – 2022-01-08 (×2): 40 mg via ORAL
  Filled 2022-01-07 (×2): qty 1

## 2022-01-07 MED ORDER — FAMOTIDINE IN NACL 20-0.9 MG/50ML-% IV SOLN
INTRAVENOUS | Status: AC | PRN
  Administered 2022-01-07: 20 mg via INTRAVENOUS

## 2022-01-07 MED ORDER — LORATADINE 10 MG PO TABS
5.0000 mg | ORAL_TABLET | Freq: Every evening | ORAL | Status: DC
  Filled 2022-01-07: qty 1

## 2022-01-07 MED ORDER — HEPARIN (PORCINE) IN NACL 1000-0.9 UT/500ML-% IV SOLN
INTRAVENOUS | Status: AC
  Filled 2022-01-07: qty 1000

## 2022-01-07 MED ORDER — CLOPIDOGREL BISULFATE 75 MG PO TABS
75.0000 mg | ORAL_TABLET | Freq: Every day | ORAL | Status: DC
  Administered 2022-01-08: 75 mg via ORAL
  Filled 2022-01-07: qty 1

## 2022-01-07 MED ORDER — ATORVASTATIN CALCIUM 80 MG PO TABS
80.0000 mg | ORAL_TABLET | Freq: Every day | ORAL | Status: DC
  Administered 2022-01-07 – 2022-01-08 (×2): 80 mg via ORAL
  Filled 2022-01-07 (×2): qty 1

## 2022-01-07 MED ORDER — HYDRALAZINE HCL 20 MG/ML IJ SOLN: 10.0000 mg | INTRAMUSCULAR | Status: AC | PRN

## 2022-01-07 MED ORDER — HEPARIN SODIUM (PORCINE) 1000 UNIT/ML IJ SOLN
INTRAMUSCULAR | Status: DC | PRN
  Administered 2022-01-07 (×2): 5000 [IU] via INTRAVENOUS
  Administered 2022-01-07: 3000 [IU] via INTRAVENOUS

## 2022-01-07 MED ORDER — SODIUM CHLORIDE 0.9% FLUSH: 3.0000 mL | INTRAVENOUS | Status: DC | PRN

## 2022-01-07 MED ORDER — HEPARIN (PORCINE) IN NACL 1000-0.9 UT/500ML-% IV SOLN
INTRAVENOUS | Status: DC | PRN
  Administered 2022-01-07 (×2): 500 mL

## 2022-01-07 MED ORDER — DULOXETINE HCL 60 MG PO CPEP
60.0000 mg | ORAL_CAPSULE | Freq: Every day | ORAL | Status: DC
Start: 2022-01-07 — End: 2022-01-08
  Administered 2022-01-07 – 2022-01-08 (×2): 60 mg via ORAL
  Filled 2022-01-07 (×2): qty 1

## 2022-01-07 MED ORDER — CLOPIDOGREL BISULFATE 300 MG PO TABS
ORAL_TABLET | ORAL | Status: DC | PRN
  Administered 2022-01-07: 600 mg via ORAL

## 2022-01-07 MED ORDER — ASPIRIN 81 MG PO CHEW
81.0000 mg | CHEWABLE_TABLET | Freq: Every day | ORAL | Status: DC
  Administered 2022-01-08: 81 mg via ORAL
  Filled 2022-01-07: qty 1

## 2022-01-07 MED ORDER — LORATADINE-PSEUDOEPHEDRINE ER 5-120 MG PO TB12
1.0000 | ORAL_TABLET | Freq: Every day | ORAL | Status: DC
Start: 2022-01-07 — End: 2022-01-07

## 2022-01-07 MED ORDER — SODIUM CHLORIDE 0.9 % IV SOLN
250.0000 mL | INTRAVENOUS | Status: DC | PRN
Start: 2022-01-08 — End: 2022-01-08

## 2022-01-07 MED ORDER — TRAZODONE HCL 100 MG PO TABS: 100.0000 mg | ORAL_TABLET | Freq: Every evening | ORAL | Status: DC | PRN

## 2022-01-07 MED ORDER — SODIUM CHLORIDE 0.9 % WEIGHT BASED INFUSION
1.0000 mL/kg/h | INTRAVENOUS | Status: DC
  Administered 2022-01-07: 250 mL via INTRAVENOUS

## 2022-01-07 MED ORDER — POLYVINYL ALCOHOL 1.4 % OP SOLN: 1.0000 [drp] | Freq: Three times a day (TID) | OPHTHALMIC | Status: DC | PRN

## 2022-01-07 MED ORDER — VERAPAMIL HCL 2.5 MG/ML IV SOLN
INTRAVENOUS | Status: DC | PRN
  Administered 2022-01-07: 10 mL via INTRA_ARTERIAL

## 2022-01-07 MED ORDER — CLOPIDOGREL BISULFATE 300 MG PO TABS
ORAL_TABLET | ORAL | Status: AC
  Filled 2022-01-07: qty 2

## 2022-01-07 MED ORDER — FENTANYL CITRATE (PF) 100 MCG/2ML IJ SOLN
INTRAMUSCULAR | Status: AC
  Filled 2022-01-07: qty 2

## 2022-01-07 MED ORDER — IOHEXOL 350 MG/ML SOLN
INTRAVENOUS | Status: DC | PRN
  Administered 2022-01-07: 188 mL

## 2022-01-07 MED ORDER — ATORVASTATIN CALCIUM 80 MG PO TABS: 80.0000 mg | ORAL_TABLET | Freq: Every day | ORAL | Status: DC

## 2022-01-07 MED ORDER — FAMOTIDINE IN NACL 20-0.9 MG/50ML-% IV SOLN
INTRAVENOUS | Status: AC
  Filled 2022-01-07: qty 50

## 2022-01-07 MED ORDER — LABETALOL HCL 5 MG/ML IV SOLN: 10.0000 mg | INTRAVENOUS | Status: AC | PRN

## 2022-01-07 MED ORDER — MIDAZOLAM HCL 2 MG/2ML IJ SOLN
INTRAMUSCULAR | Status: DC | PRN
  Administered 2022-01-07 (×3): 1 mg via INTRAVENOUS

## 2022-01-07 MED ORDER — ATORVASTATIN CALCIUM 80 MG PO TABS: 80.0000 mg | ORAL_TABLET | ORAL | Status: DC

## 2022-01-07 MED ORDER — ASPIRIN 81 MG PO CHEW
CHEWABLE_TABLET | ORAL | Status: AC
  Filled 2022-01-07: qty 1

## 2022-01-07 MED ORDER — HEPARIN SODIUM (PORCINE) 1000 UNIT/ML IJ SOLN
INTRAMUSCULAR | Status: AC
  Filled 2022-01-07: qty 10

## 2022-01-07 MED ORDER — NITROGLYCERIN 1 MG/10 ML FOR IR/CATH LAB
INTRA_ARTERIAL | Status: AC
  Filled 2022-01-07: qty 10

## 2022-01-07 MED ORDER — SODIUM CHLORIDE 0.9% FLUSH
3.0000 mL | Freq: Two times a day (BID) | INTRAVENOUS | Status: DC
  Administered 2022-01-08: 3 mL via INTRAVENOUS

## 2022-01-07 MED ORDER — LIDOCAINE HCL (PF) 1 % IJ SOLN
INTRAMUSCULAR | Status: DC | PRN
  Administered 2022-01-07: 2 mL

## 2022-01-07 MED ORDER — CARBOXYMETHYLCELLULOSE SODIUM 0.5 % OP SOLN: 1.0000 [drp] | Freq: Three times a day (TID) | OPHTHALMIC | Status: DC | PRN

## 2022-01-07 MED ORDER — ACETAMINOPHEN 325 MG PO TABS: 650.0000 mg | ORAL_TABLET | ORAL | Status: DC | PRN

## 2022-01-07 MED ORDER — FENTANYL CITRATE (PF) 100 MCG/2ML IJ SOLN
INTRAMUSCULAR | Status: DC | PRN
Start: 2022-01-07 — End: 2022-01-07
  Administered 2022-01-07 (×3): 25 ug via INTRAVENOUS

## 2022-01-07 MED ORDER — MIDAZOLAM HCL 2 MG/2ML IJ SOLN
INTRAMUSCULAR | Status: AC
  Filled 2022-01-07: qty 2

## 2022-01-07 MED ORDER — LOSARTAN POTASSIUM 50 MG PO TABS
100.0000 mg | ORAL_TABLET | Freq: Every day | ORAL | Status: DC
  Administered 2022-01-08: 100 mg via ORAL
  Filled 2022-01-07: qty 2

## 2022-01-07 MED ORDER — ATENOLOL 25 MG PO TABS
25.0000 mg | ORAL_TABLET | Freq: Every day | ORAL | Status: DC
  Administered 2022-01-07 – 2022-01-08 (×2): 25 mg via ORAL
  Filled 2022-01-07 (×2): qty 1

## 2022-01-07 MED ORDER — LIDOCAINE HCL (PF) 1 % IJ SOLN
INTRAMUSCULAR | Status: AC
  Filled 2022-01-07: qty 30

## 2022-01-07 MED ORDER — SODIUM CHLORIDE 0.9 % IV SOLN: 250.0000 mL | INTRAVENOUS | Status: DC | PRN

## 2022-01-07 MED ORDER — SODIUM CHLORIDE 0.9 % WEIGHT BASED INFUSION
1.0000 mL/kg/h | INTRAVENOUS | Status: AC
  Administered 2022-01-07: 1 mL/kg/h via INTRAVENOUS

## 2022-01-07 MED ORDER — MORPHINE SULFATE (PF) 2 MG/ML IV SOLN: 2.0000 mg | INTRAVENOUS | Status: DC | PRN

## 2022-01-07 MED ORDER — VERAPAMIL HCL 2.5 MG/ML IV SOLN
INTRAVENOUS | Status: AC
  Filled 2022-01-07: qty 2

## 2022-01-07 MED ORDER — OXYCODONE HCL 5 MG PO TABS
15.0000 mg | ORAL_TABLET | Freq: Every day | ORAL | Status: DC | PRN
  Administered 2022-01-07 – 2022-01-08 (×3): 15 mg via ORAL
  Filled 2022-01-07 (×3): qty 3

## 2022-01-07 MED ORDER — NITROGLYCERIN 1 MG/10 ML FOR IR/CATH LAB
INTRA_ARTERIAL | Status: DC | PRN
  Administered 2022-01-07: 200 ug via INTRACORONARY

## 2022-01-07 MED ORDER — PSEUDOEPHEDRINE HCL ER 120 MG PO TB12
120.0000 mg | ORAL_TABLET | Freq: Every evening | ORAL | Status: DC
  Filled 2022-01-07 (×2): qty 1

## 2022-01-07 MED ORDER — ONDANSETRON HCL 4 MG/2ML IJ SOLN: 4.0000 mg | Freq: Four times a day (QID) | INTRAMUSCULAR | Status: DC | PRN

## 2022-01-07 MED ORDER — ASPIRIN 81 MG PO CHEW
81.0000 mg | CHEWABLE_TABLET | ORAL | Status: AC
  Administered 2022-01-07: 81 mg via ORAL

## 2022-01-07 MED ORDER — SODIUM CHLORIDE 0.9 % WEIGHT BASED INFUSION
3.0000 mL/kg/h | INTRAVENOUS | Status: AC
  Administered 2022-01-07: 3 mL/kg/h via INTRAVENOUS

## 2022-01-07 SURGICAL SUPPLY — 22 items
BALL SAPPHIRE NC24 2.75X22 (BALLOONS) ×2
BALLN SAPPHIRE 2.5X15 (BALLOONS) ×2
BALLOON SAPPHIRE 2.5X15 (BALLOONS) IMPLANT
BALLOON SAPPHIRE NC24 2.75X22 (BALLOONS) IMPLANT
BAND ZEPHYR COMPRESS 30 LONG (HEMOSTASIS) ×1 IMPLANT
CATH INFINITI JR4 5F (CATHETERS) ×1 IMPLANT
CATH OPTITORQUE TIG 4.0 5F (CATHETERS) ×1 IMPLANT
CATH VISTA GUIDE 6FR XB3.5 (CATHETERS) ×1 IMPLANT
GLIDESHEATH SLEND SS 6F .021 (SHEATH) ×1 IMPLANT
GUIDEWIRE INQWIRE 1.5J.035X260 (WIRE) IMPLANT
INQWIRE 1.5J .035X260CM (WIRE) ×2
KIT ENCORE 26 ADVANTAGE (KITS) ×1 IMPLANT
KIT HEART LEFT (KITS) ×2 IMPLANT
PACK CARDIAC CATHETERIZATION (CUSTOM PROCEDURE TRAY) ×2 IMPLANT
SHEATH PROBE COVER 6X72 (BAG) ×1 IMPLANT
STENT SYNERGY XD 2.50X12 (Permanent Stent) IMPLANT
STENT SYNERGY XD 2.50X32 (Permanent Stent) IMPLANT
SYNERGY XD 2.50X12 (Permanent Stent) ×2 IMPLANT
SYNERGY XD 2.50X32 (Permanent Stent) ×2 IMPLANT
TRANSDUCER W/STOPCOCK (MISCELLANEOUS) ×2 IMPLANT
TUBING CIL FLEX 10 FLL-RA (TUBING) ×2 IMPLANT
WIRE RUNTHROUGH .014X180CM (WIRE) ×1 IMPLANT

## 2022-01-07 NOTE — Brief Op Note (Signed)
BRIEF CARDIAC CATH/PCI NOTE   Logan Roth  MRN: [id number] 01/07/2022 4:18 PM  Primary Care Provider: Angelyn Punt. Glennon Mac., MD [facility]) Cardiologist: Dr. Roxan Diesel [facility]) Interventional Cardiologist:Gricelda Foland Ellyn Hack, MD  PROCEDURE:  Procedure(s): LEFT HEART CATH AND CORONARY ANGIOGRAPHY (N/A) CORONARY STENT INTERVENTION (N/A)  SURGEON:  Surgeon(s) and Role:    * Leonie Man, MD - Primary   PATIENT: Logan Roth is a 66 y.o. male with a diagnosis of COPD/Emphysema with Pulmonary Nodules, HTN, HLD, (with baseline RBBB on EKG) who is being seen today for the evaluation of CHEST PAIN AND SHORTNESS OF BREATH ON EXERTION along with ABNORMAL NUCLEAR STRESS TEST (essentially consistent with PROGRESSIVE ANGINA) who was seen in consultation on 01/06/2022 the request of Leone Brand, MBBch BAO, [facility].   Sharif Rendell was just seen by Dr. Edson Snowball on 01/05/2022 as an add on clinic patient to discuss results of his stress test that was read as abnormal with a reversible inferior defect.  Dr. Edson Snowball was concerned about the results and the patient's symptoms and requested patient be seen at the earliest convenience and potentially schedule cardiac catheterization stress test.    He has chest tightness and shortness of breath with minimal exertion with extreme fatigue.  He also has symptoms of palpitations.  He does have baseline COPD dyspnea but this is well out of portion with amount of COPD that he has been having.   He has intermittent right greater than left lower extremity edema but not present now.  He is usually dependent edema and not associate with PND orthopnea.  Occasional palpitations and skipped beats but no rapid irregular heartbeats.   The following studies were reviewed today: (if available, images/films reviewed: From Epic Chart or Care Everywhere) Myoview Stress Test 09/13/2019: EF 52%.  No ischemia or infarction.  LOW RISK. Lexiscan  Myoview 12/13/2021: Small to moderate-sized medium intensity reversible defect in the mid to apical inferior wall concerning for ischemia.  Mildly reduced EF-46% with mild posterior basal hypokinesis suggestive of possible prior nontransmural infarct in the RCA territory..  INTERMEDIATE RISK    PRE-OPERATIVE DIAGNOSIS:  abnormal stress test, unstable angina  POST-OPERATIVE DIAGNOSIS: Severe Three-Vessel NativeCAD: Essentially Sub total CTO of RCA with a mid vessel segmental 90% stenosis followed by subtotal occlusion distally before the bifurcation with very faint distal flow and faint left to right collaterals.  (Suspect this is the culprit of the RCA territory nontransmural infarct) Large LCx with major OM1-ostial and proximal 90% focal lesion; mid LCx after small AV groove takeoff has a long area of 80% stenosis LAD has mild to moderate mid vessel calcific disease. Mildly reduced LVEF (roughly 45%) with inferior hypokinesis.  PROCEDURE PERFORMED Time Out: Verified patient identification, verified procedure, site/side was marked, verified correct patient position, special equipment/implants available, medications/allergies/relevent history reviewed, required imaging and test results available. Performed.  Access:  Right radial Artery: 6 Fr sheath -- Seldinger technique using Micropuncture Kit -- Direct ultrasound guidance used.  Permanent image obtained and placed on chart. -- 10 mL radial cocktail IA; 5000 units IV Heparin  Left Heart Catheterization: 5 Fr Catheters advanced or exchanged over a J-wire under direct fluoroscopic guidance into the ascending aorta; TIG 4.0 catheter advanced first.  * LV Hemodynamics (LV Gram): TIG 4.0 catheter * Left & Coronary Artery Cineangiography: TIG 4.0 catheter  * Right Coronary Artery Cineangiography: JR4 catheter    Review of initial angiography revealed: Subtotal occlusion of the RCA with minimal  collateral flow (mid vessel has 90+ percent stenosis  followed by subtotal occlusion in the distal).  Focal lesions in OM1 and mid LCx that are likely more approachable for percutaneous standpoint and potentially causing angina based on the distribution on stress test.  LAD has mild diffuse disease.  Preparations are made for two-vessel PCI after discussion with Dr. Irish Lack who agreed about the RCA.  Patient was given 60 mg oral clopidogrel along with 20 mg IV Pepcid and additional boluses of IV heparin to achieve and maintain ACT greater than 250 seconds  PCI to first OM1 followed by mid LCx performed: See final report for details 6 [place] XB 3.5 guide catheter, run-through wire first advanced OM1: 2.5 mm x 12 mm balloon => 2.5 mm by 12 mm Synergy DES deployed to 2.6 mm and postdilated with stent balloon to high ATM.  2.7 mm.;  . The wire was then redirected into the LCx, LCx lesion and predilated with the same 2.5 mm balloon.  Stent was a 2.5 mm x 32 Synergy DES deployed to high ATM, and then postdilated in the mid-stent with a 2.75 mm x 20 mm [place] balloon to roughly 2.8 mm.   Upon completion of Angiogaphy, the catheter was removed completely out of the body over a wire, without complication.    Radial sheath removed in the Cardiac Catheterization lab with Zephyr pneumatic band placed for hemostasis.  MEDICATIONS SQ Lidocaine 3 mL Radial Cocktail: 3 mg Verapmil in 10 mL NS Heparin: Total of 1300 units IC NTG 200 mcg x 1 IV NS bolus 250 mg Plavix 600 mg p.o. Pepcid 20 mg IV  ANESTHESIA:   local and IV sedation  EBL:  < 20 mL   BLOOD ADMINISTERED:none  DRAINS:  All catheters and sheaths removed prior to leaving the Cath Lab    LOCAL MEDICATIONS USED:  LIDOCAINE  COUNTS:  YES  DICTATION: .Note written in EPIC  PLAN OF CARE: Admit for overnight observation DAPT uninterrupted for 6 months, then we will continue monotherapy clopidogrel for another 6 months. Dr. Edson Snowball had written for 50 mg of atenolol as outpatient, will just start 25  mg here and DC amlodipine. Convert from pravastatin to atorvastatin 80 mg   PATIENT DISPOSITION:  PACU - hemodynamically stable.   Delay start of Pharmacological VTE agent (>24hrs) due to surgical blood loss or risk of bleeding: not applicable   Glenetta Hew, MD

## 2022-01-07 NOTE — Interval H&P Note (Signed)
History and Physical Interval Note:  01/07/2022 1:46 PM  Logan Roth  has presented today for surgery, with the diagnosis of abnormal stress test, unstable angina.  The various methods of treatment have been discussed with the patient and family. After consideration of risks, benefits and other options for treatment, the patient has consented to  Procedure(s): LEFT HEART CATH AND CORONARY ANGIOGRAPHY (N/A) PERCUTANEOUS CORONARY INTERVENTION     as a surgical intervention.  The patient's history has been reviewed, patient examined, no change in status, stable for surgery.  I have reviewed the patient's chart and labs.  Questions were answered to the patient's satisfaction.    Cath Lab Visit (complete for each Cath Lab visit)  Clinical Evaluation Leading to the Procedure:   ACS: No.  Non-ACS:    Anginal Classification: CCS III  Anti-ischemic medical therapy: Minimal Therapy (1 class of medications)  Non-Invasive Test Results: Intermediate-risk stress test findings: cardiac mortality 1-3%/year  Prior CABG: No previous CABG   [name]

## 2022-01-07 NOTE — Discharge Summary (Incomplete)
Discharge Summary    Patient ID: Logan Roth MRN: [id number]; DOB: Mar 22, 1956  Admit date: 01/07/2022 Discharge date: 01/08/2022  PCP:  Sherald Hess., MD   [facility] Providers Cardiologist:  Glenetta Hew, MD   {   Discharge Diagnoses    Principal Problem:   CAD S/P percutaneous coronary angioplasty Active Problems:   Abnormal nuclear stress test   Essential hypertension   Hyperlipidemia with target LDL less than 70   COPD (chronic obstructive pulmonary disease) ([place])   GERD (gastroesophageal reflux disease)    Diagnostic Studies/Procedures    Left Cardiac Catheterization 01/07/2022:   Lesion #1 1st Mrg lesion is 85% stenosed.   A drug-eluting stent was successfully placed using a SYNERGY XD 2.50X12.-Postdilated with stent balloon to 2.7 mm   Post intervention, there is a 0% residual stenosis.   Lesion #2 mid Cx to Dist Cx lesion is 80% stenosed.   A drug-eluting stent was successfully placed using a SYNERGY XD 2.50X32.  Postdilated in the mid stent to 2.8 mm, otherwise 2.6 mm   Post intervention, there is a 0% residual stenosis.  ------------------------------------------------   Prox RCA to Mid RCA lesion is 95% stenosed.   Mid RCA to Dist RCA lesion is 100% stenosed. ->  Appears to be essentially chronically occluded with trivial flow distally likely collaterals.   Prox LAD to Mid LAD lesion is 30% stenosed. ------------------------------------------------   There is mild to moderate left ventricular systolic dysfunction.  The left ventricular ejection fraction is 35-45% by visual estimate.   LV end diastolic pressure is normal.   There is no aortic valve stenosis.   Post Op Diagnoses Severe Three-Vessel NativeCAD: Essentially Sub total CTO of RCA with a mid vessel segmental 90% stenosis followed by subtotal occlusion distally before the bifurcation with very faint distal flow and faint left to right collaterals.  (Suspect this is the culprit of the RCA  territory nontransmural infarct) Large LCx with major OM1-ostial and proximal 90% focal lesion; mid LCx after small AV groove takeoff has a long area of 80% stenosis SUCCESSFUL PCI to first OM1 followed by mid LCx performed: TIMI 3 both lesions reduced to 0%) Prox OM1 85%: 2.5 mm by 12 mm Synergy DES deployed to 2.6 mm and postdilated with stent balloon to high ATM.  2.7 mm.;  . midLCx 80%:  2.5 mm x 32 Synergy DES deployed to high ATM, and then postdilated in the mid-stent with a 2.75 mm x 20 mm [place] balloon to roughly 2.8 mm. LAD has mild to moderate mid vessel calcific disease. Mildly reduced LVEF (roughly 45%) with inferior hypokinesis.   Recommendations: Monitor overnight post PCI, discharge in the morning. Follow-up as scheduled with Cadence Kathlen Mody, PA in [place] office.  He will then follow-up with Dr. Edson Snowball Switch from pravastatin to atorvastatin DAPT Dr. Edson Snowball changed from amlodipine to atenolol, will go ahead and prescribe atenolol here but at lower dose and he prescribed and monitor. Diagnostic Dominance: Right  Intervention     _____________   History of Present Illness     Logan Roth is a 66 y.o. male with a history of chest pain with recent abnormal stress test on 7 05/2022, RBBB, COPD with emphysema, pulmonary nodules, hypertension, hyperlipidemia, GERD, Crohn's disease, bipolar disorder, PTSD, and chronic back pain on Oxycodone and Buprenorphine.  Patient was recently seen at his PCPs office and reported symptoms concerning for progressive angina. Lexiscan Myoview was performed on 01/05/2022 and was abnormal showing a small to moderate-sized  medium intensity reversible defect in the mid to apical inferior wall concerning for ischemia.  EF looked mildly reduced at 46% with mild posterior basal hypokinesis suggestive of possible prior nontransmural infarct in the RCA territory.  He was urgently referred to cardiology and was seen by Dr. Ellyn Hack on 01/06/2022.  He  reported chest tightness and shortness of breath with minimal exertion as well as extreme fatigue. He does have some baseline dyspnea due to his COPD but he felt like his his shortness of breath was out of proportion to his COPD He also reported palpitations as well as intermittent lower extremity edema (right greater than left). He denied any PND orthopnea.  He does have some baseline dyspnea due to his COPD but he felt like his his shortness of breath was out of proportion to his COPD.given concerning symptoms and abnormal stress test, outpatient cardiac catheterization was arranged.  Hospital Course     Consultants: None  Patient presented to [facility] on 01/07/2022 for planned outpatient cardiac catheterization.  LHC showed severe three-vessel CAD with essentially subtotal CTO of RCA with a mid vessel segmental 90% stenosis followed by subtotal occlusion distally before the bifurcation with very faint distal flow and faint left to right collaterals (suspected to be the culprit of the RCA territory nontransmural infarct) as well as a 90% focal stenosis of the ostial and proximal OM1 and a long area of 80% stenosis of the mid LCx. LEVF mildly reduced roughly 45% with inferior hypokinesis. Patient underwent successful PCI with DES to the proximal OM1 lesion and DES to the mid LCx lesion.  He was started on DAPT and admitted overnight for observation.  He is doing well this morning. He was able to ambulate with Cardiac Rehab without any chest pain. He will be discharged on DAPT with Aspirin 81 mg daily and Plavix 5m daily with plans to continue this for a minimum of 6 months and then likely stop Aspirin and continue Plavix monotherapy.  His home Pravastatin was switched to Lipitor 80 mg daily. Will need repeat lipid panel and LFTs in 6-8 weeks. His PCP recently stopped his Amlodipine and switched him to Atenolol which we will continue but at a lower dose of 231mdaily given heart rates in the 50s to  60s. BP soft at times so will decrease home Losartan to 5073maily. He was recently prescribed Imdur but has not started taking this yet. He is not currently having any chest pain. Will stop his Imdur for now given soft BP. Patient is on [place] reflux. Reviewed this on [facility] and there is no known interaction of Dexilant and Plavix like there are with some other PPIs; therefore, OK to continue. He does take pseudoephedrine. Recommended avoiding decongestants. Otherwise, continue home medications.  Of note, creatinine up slightly at 1.34. Baseline around 1.1 to 1.2. Given soft BP, will decrease Losartan as above. Hemoglobin also dropped from 13.8 on pre-procedural labs and 11.8 on day of discharge. No obvious bleeding. Cath site stable. Will have patient come in for repeat CBC and BMET in 1 week.  Patient seen and examined by Dr. PemJohney Frameday and determined to be stable for discharge. Outpatient follow-up arranged. Medications as below.   Did the patient have an acute coronary syndrome (MI, NSTEMI, STEMI, etc) this admission?:  No  Did the patient have a percutaneous coronary intervention (stent / angioplasty)?:  Yes.     Cath/PCI Registry Performance & Quality Measures: Aspirin prescribed? - Yes ADP Receptor Inhibitor (Plavix/Clopidogrel, Brilinta/Ticagrelor or Effient/Prasugrel) prescribed (includes medically managed patients)? - Yes High Intensity Statin (Lipitor 40-43m or Crestor 20-433m prescribed? - Yes For EF <40%, was ACEI/ARB prescribed? - Not Applicable (EF >/= 4034%For EF <40%, Aldosterone Antagonist (Spironolactone or Eplerenone) prescribed? - Not Applicable (EF >/= 4074%Cardiac Rehab Phase II ordered? - Yes   _____________  Discharge Vitals Blood pressure 114/75, pulse (!) 58, temperature 98.6 F (37 C), temperature source Oral, resp. rate 16, height 5' 8"  (1.727 m), weight 104.3 kg, SpO2 97 %.  Filed Weights   01/07/22 1216  Weight:  104.3 kg   General: [age].o. Caucasian male resting comfortably in no acute distress. HEENT: Normocephalic and atraumatic. Sclera clear.  Neck: Supple. No JVD. Heart: RRR. Distinct S1 and S2. No murmurs, gallops, or rubs. Right radial cath site soft with very mild ecchymosis but no signs of hematoma. Lungs: No increased work of breathing. Clear to ausculation bilaterally. No wheezes, rhonchi, or rales.  Abdomen: Soft, non-distended, and non-tender to palpation.  MSK: Normal strength and tone for age. Extremities: No lower extremity edema. Skin: Warm and dry. Neuro: Alert and oriented x3. No focal deficits. Psych: Normal affect. Responds appropriately.   Labs & Radiologic Studies    CBC Recent Labs    01/06/22 1253 01/08/22 0255  WBC 8.0 7.1  HGB 13.8 11.8*  HCT 42.4 35.7*  MCV 86.0 87.5  PLT 303 22259 Basic Metabolic Panel Recent Labs    01/06/22 1253 01/08/22 0255  NA 138 142  K 5.0 4.3  CL 105 108  CO2 26 23  GLUCOSE 108* 113*  BUN 17 20  CREATININE 1.20 1.34*  CALCIUM 9.1 8.3*   Liver Function Tests Recent Labs    01/06/22 1253  AST 23  ALT 26  ALKPHOS 78  BILITOT 0.6  PROT 7.8  ALBUMIN 4.3   No results for input(s): "LIPASE", "AMYLASE" in the last 72 hours. High Sensitivity Troponin:   No results for input(s): "TROPONINIHS" in the last 720 hours.  BNP Invalid input(s): "POCBNP" D-Dimer No results for input(s): "DDIMER" in the last 72 hours. Hemoglobin A1C No results for input(s): "HGBA1C" in the last 72 hours. Fasting Lipid Panel No results for input(s): "CHOL", "HDL", "LDLCALC", "TRIG", "CHOLHDL", "LDLDIRECT" in the last 72 hours. Thyroid Function Tests No results for input(s): "TSH", "T4TOTAL", "T3FREE", "THYROIDAB" in the last 72 hours.  Invalid input(s): "FREET3" _____________  CARDIAC CATHETERIZATION  Result Date: 01/07/2022   Lesion #1 1st Mrg lesion is 85% stenosed.   A drug-eluting stent was successfully placed using a SYNERGY XD  2.50X12.-Postdilated with stent balloon to 2.7 mm   Post intervention, there is a 0% residual stenosis.   Lesion #2 mid Cx to Dist Cx lesion is 80% stenosed.   A drug-eluting stent was successfully placed using a SYNERGY XD 2.50X32.  Postdilated in the mid stent to 2.8 mm, otherwise 2.6 mm   Post intervention, there is a 0% residual stenosis.   ------------------------------------------------   Prox RCA to Mid RCA lesion is 95% stenosed.   Mid RCA to Dist RCA lesion is 100% stenosed. ->  Appears to be essentially chronically occluded with trivial flow distally likely collaterals.   Prox LAD to Mid LAD lesion is 30% stenosed.   ------------------------------------------------   There is mild to moderate left  ventricular systolic dysfunction.  The left ventricular ejection fraction is 35-45% by visual estimate.   LV end diastolic pressure is normal.   There is no aortic valve stenosis. POST-OPERATIVE DIAGNOSIS: Severe Three-Vessel NativeCAD: Essentially Sub total CTO of RCA with a mid vessel segmental 90% stenosis followed by subtotal occlusion distally before the bifurcation with very faint distal flow and faint left to right collaterals.  (Suspect this is the culprit of the RCA territory nontransmural infarct) Large LCx with major OM1-ostial and proximal 90% focal lesion; mid LCx after small AV groove takeoff has a long area of 80% stenosis SUCCESSFUL PCI to first OM1 followed by mid LCx performed: TIMI 3 both lesions reduced to 0%) Prox OM1 85%: 2.5 mm by 12 mm Synergy DES deployed to 2.6 mm and postdilated with stent balloon to high ATM.  2.7 mm.;  . midLCx 80%:  2.5 mm x 32 Synergy DES deployed to high ATM, and then postdilated in the mid-stent with a 2.75 mm x 20 mm [place] balloon to roughly 2.8 mm. LAD has mild to moderate mid vessel calcific disease. Mildly reduced LVEF (roughly 45%) with inferior hypokinesis. RECOMMENDATIONS Monitor overnight post PCI, discharge in the morning. Follow-up as scheduled with Cadence  Kathlen Mody, PA in [place] office.  He will then follow-up with Dr. Edson Snowball Switch from pravastatin to atorvastatin DAPT Dr. Edson Snowball changed from amlodipine to atenolol, will go ahead and prescribe atenolol here but at lower dose and he prescribed and monitor. Glenetta Hew    Disposition   Patient is being discharged home today in good condition.  Follow-up Plans & Appointments     Follow-up Information     Furth, Cadence H, PA-C Follow up.   Specialty: Cardiology Why: Follow-up visit with Cardiology scheduled for 01/20/2022 at 9:15am in our [facility]. Please arrive 15 minutes early for check-in. Contact information: [place] [address] [phone]                Discharge Instructions     Amb Referral to Cardiac Rehabilitation   Complete by: As directed    Diagnosis: Coronary Stents   After initial evaluation and assessments completed: Virtual Based Care may be provided alone or in conjunction with Phase 2 Cardiac Rehab based on patient barriers.: Yes   Diet - low sodium heart healthy   Complete by: As directed    Increase activity slowly   Complete by: As directed        Discharge Medications   Allergies as of 01/08/2022   No Known Allergies      Medication List     STOP taking these medications    amLODipine 10 MG tablet Commonly known as: NORVASC   isosorbide mononitrate 30 MG 24 hr tablet Commonly known as: IMDUR   pravastatin 80 MG tablet Commonly known as: PRAVACHOL   pseudoephedrine 30 MG tablet Commonly known as: SUDAFED       TAKE these medications    Advair HFA 45-21 MCG/ACT inhaler Generic drug: fluticasone-salmeterol Inhale 2 puffs into the lungs 2 (two) times daily.   albuterol 108 (90 Base) MCG/ACT inhaler Commonly known as: VENTOLIN HFA Inhale 1-2 puffs into the lungs every 6 (six) hours as needed for wheezing or shortness of breath.   allopurinol 100 MG tablet Commonly known as:  ZYLOPRIM Take 100 mg by mouth daily.   amitriptyline 50 MG tablet Commonly known as: ELAVIL Take 50 mg by mouth at bedtime as needed for sleep.  aspirin 81 MG chewable tablet Chew 1 tablet (81 mg total) by mouth daily.   atenolol 25 MG tablet Commonly known as: TENORMIN Take 1 tablet (25 mg total) by mouth daily. Start taking on: January 09, 2022 What changed:  medication strength how much to take   atorvastatin 80 MG tablet Commonly known as: LIPITOR Take 1 tablet (80 mg total) by mouth daily.   bismuth subsalicylate 174 BS/49QP suspension Commonly known as: PEPTO BISMOL Take 30 mLs by mouth daily.   buprenorphine 8 MG Subl SL tablet Commonly known as: SUBUTEX Place 8 mg under the tongue 2 (two) times daily.   Calcium 600+D 600-20 MG-MCG Tabs Generic drug: Calcium Carb-Cholecalciferol Take 1 tablet by mouth daily.   carboxymethylcellulose 0.5 % Soln Commonly known as: REFRESH PLUS Place 1 drop into both eyes 3 (three) times daily as needed (dry/irritated eyes.).   cetirizine 10 MG tablet Commonly known as: ZYRTEC Take 10 mg by mouth daily.   Cholecalciferol 25 MCG (1000 UT) Tbdp Take 1 capsule by mouth daily.   clopidogrel 75 MG tablet Commonly known as: PLAVIX Take 1 tablet (75 mg total) by mouth daily with breakfast.   Dexilant 60 MG capsule Generic drug: dexlansoprazole Take 60 mg by mouth daily before breakfast.   diazepam 2 MG tablet Commonly known as: VALIUM Take 2 mg by mouth every 6 (six) hours as needed for anxiety.   DULoxetine 60 MG capsule Commonly known as: CYMBALTA Take 60 mg by mouth daily.   ferrous sulfate 325 (65 FE) MG tablet Take 325 mg by mouth in the morning and at bedtime.   loperamide 2 MG capsule Commonly known as: IMODIUM Take 2 mg by mouth daily.   losartan 50 MG tablet Commonly known as: COZAAR Take 1 tablet (50 mg total) by mouth daily. What changed:  medication strength how much to take   multivitamin tablet Take  1 tablet by mouth daily.   nitroGLYCERIN 0.4 MG/SPRAY spray Commonly known as: NITROLINGUAL Place 0.4 mg under the tongue every 5 (five) minutes x 3 doses as needed for chest pain.   oxyCODONE 15 MG immediate release tablet Commonly known as: ROXICODONE Take 15 mg by mouth every 6 (six) hours.   QUEtiapine 25 MG tablet Commonly known as: SEROQUEL Take 25 mg by mouth at bedtime.   tamsulosin 0.4 MG Caps capsule Commonly known as: FLOMAX Take 0.4 mg by mouth at bedtime.   traZODone 100 MG tablet Commonly known as: DESYREL Take 150 mg by mouth at bedtime as needed for sleep.   vitamin B-12 1000 MCG tablet Commonly known as: CYANOCOBALAMIN Take 1,000 mcg by mouth daily.           Outstanding Labs/Studies   Repeat BMET in 1 week.  Repeat lipid panel and LFTs in 6-8 weeks.  Duration of Discharge Encounter   Greater than 30 minutes including physician time.  Signed, Darreld Mclean, PA-C 01/08/2022, 11:47 AM   Patient seen and examined and agree with Sande Rives, PA-C as detailed above.  In brief, the patient is a 66 y.o. male with a history of chest pain with recent abnormal stress test on 7 05/2022, RBBB, COPD with emphysema, pulmonary nodules, hypertension, hyperlipidemia, GERD, Crohn's disease, bipolar disorder, PTSD, and chronic back pain who presented to the hospital for planned cardiac cath given positive stress test and anginal pain.  Patient underwent successful PCI to OM1 and mLCx on 01/07/22 as detailed above. Doing well this morning. Cath site c/d/I with no  hematoma. Will discharge home today.  Notably, BP soft this AM. Will decrease losartan to 40m daily and hold imdur. Plan for BMET next week and follow-up in 2 weeks.  GEN: No acute distress.   Neck: No JVD Cardiac: RRR, no murmurs, rubs, or gallops.  Respiratory: Scattered rhonchi that clears with coughing GI: Soft, nontender, non-distended  MS: No edema; No deformity. Right radial access site  c/d/I without hematoma Neuro:  Nonfocal  Psych: Normal affect     Plan: -S/p successful PCI to OM1 and Lcx -Continue ASA 820mdaily, plavix 7583maily -Continue lipitor 67m63mily -Losartan decreased to 50mg43mly due to soft blood pressures -Holding imdur for now until follow-up -Will plan for BMET in 1 week to recheck renal function -Follow-up in office in 2 weeks  HeathGwyndolyn Kaufman

## 2022-01-07 NOTE — Discharge Instructions (Signed)
Medication Changes: - START Aspirin 81mg  once daily and Plavix 75mg  once daily. These medications are very important in helping keep the new stents in your heart open.  - DECREASE Atenolol to 25mg  daily. You can cut the 50mg  tablets that you have at home in half and take half a tablet once daily. We will also send you in a new prescription of the 25mg  tablets for you to have when needed. - DECREASE Losartan to 50mg  daily. You can cut the 100mg  tablet that you have at home in half and take half a tablet once daily. We will also send you in a new prescription of the 50mg  tablets for you to have when needed. - Looks like PCP recently prescribed Imdur 30mg  daily but you have not started taking this. Please DO NOT start taking.  - STOP Pravastatin and START Atorvastatin (Lipitor) 80mg  daily once daily. - STOP Amlodipine as recently directed by PCP.  - STOP Sudafed. Recommended avoiding decongestants as this can increase your blood pressure, increase your heart rate, and cause your heart to have to work harder.  **You will need repeat blood work (BMET and CBC) in 1 week (around Friday 01/14/2022). I will place the order and you can go by the Medical Mall at [facility] in [facility] [address]). No appointment is needed. Lab is open from 7:30am to 5:30pm. If it is more convenient for you to have this done at your PCP's office that is fine as well.**  Post Cardiac Catheterization: NO HEAVY LIFTING OR SEXUAL ACTIVITY X 7 DAYS. NO DRIVING X 3-5 DAYS. NO SOAKING BATHS, HOT TUBS, POOLS, ETC., X 7 DAYS.   Radial Site Care: Refer to this sheet in the next few weeks. These instructions provide you with information on caring for yourself after your procedure. Your caregiver may also give you more specific instructions. Your treatment has been planned according to current medical practices, but problems sometimes occur. Call your caregiver if you have any problems or questions after your procedure. HOME CARE  INSTRUCTIONS You may shower the day after the procedure. Remove the bandage (dressing) and gently wash the site with plain soap and water. Gently pat the site dry.  Do not apply powder or lotion to the site.  Do not submerge the affected site in water for 3 to 5 days.  Inspect the site at least twice daily.  Do not flex or bend the affected arm for 24 hours.  No lifting over 5 pounds (2.3 kg) for 5 days after your procedure.  Do not drive home if you are discharged the same day of the procedure. Have someone else drive you.  What to expect: Any bruising will usually fade within 1 to 2 weeks.  Blood that collects in the tissue (hematoma) may be painful to the touch. It should usually decrease in size and tenderness within 1 to 2 weeks.  SEEK IMMEDIATE MEDICAL CARE IF: You have unusual pain at the radial site.  You have redness, warmth, swelling, or pain at the radial site.  You have drainage (other than a small amount of blood on the dressing).  You have chills.  You have a fever or persistent symptoms for more than 72 hours.  You have a fever and your symptoms suddenly get worse.  Your arm becomes pale, cool, tingly, or numb.  You have heavy bleeding from the site. Hold pressure on the site.

## 2022-01-08 DIAGNOSIS — I10 Essential (primary) hypertension: Secondary | ICD-10-CM

## 2022-01-08 DIAGNOSIS — Z9861 Coronary angioplasty status: Secondary | ICD-10-CM | POA: Diagnosis not present

## 2022-01-08 DIAGNOSIS — R9439 Abnormal result of other cardiovascular function study: Secondary | ICD-10-CM

## 2022-01-08 DIAGNOSIS — K219 Gastro-esophageal reflux disease without esophagitis: Secondary | ICD-10-CM | POA: Diagnosis not present

## 2022-01-08 DIAGNOSIS — Z79899 Other long term (current) drug therapy: Secondary | ICD-10-CM | POA: Diagnosis not present

## 2022-01-08 DIAGNOSIS — E785 Hyperlipidemia, unspecified: Secondary | ICD-10-CM

## 2022-01-08 DIAGNOSIS — Z955 Presence of coronary angioplasty implant and graft: Secondary | ICD-10-CM | POA: Diagnosis not present

## 2022-01-08 DIAGNOSIS — I2510 Atherosclerotic heart disease of native coronary artery without angina pectoris: Secondary | ICD-10-CM | POA: Diagnosis not present

## 2022-01-08 DIAGNOSIS — E7800 Pure hypercholesterolemia, unspecified: Secondary | ICD-10-CM | POA: Diagnosis not present

## 2022-01-08 DIAGNOSIS — K5090 Crohn's disease, unspecified, without complications: Secondary | ICD-10-CM | POA: Diagnosis not present

## 2022-01-08 DIAGNOSIS — Z7982 Long term (current) use of aspirin: Secondary | ICD-10-CM | POA: Diagnosis not present

## 2022-01-08 DIAGNOSIS — Z7902 Long term (current) use of antithrombotics/antiplatelets: Secondary | ICD-10-CM | POA: Diagnosis not present

## 2022-01-08 DIAGNOSIS — G8929 Other chronic pain: Secondary | ICD-10-CM | POA: Diagnosis not present

## 2022-01-08 DIAGNOSIS — I2582 Chronic total occlusion of coronary artery: Secondary | ICD-10-CM | POA: Diagnosis not present

## 2022-01-08 DIAGNOSIS — J449 Chronic obstructive pulmonary disease, unspecified: Secondary | ICD-10-CM | POA: Diagnosis not present

## 2022-01-08 DIAGNOSIS — I25110 Atherosclerotic heart disease of native coronary artery with unstable angina pectoris: Secondary | ICD-10-CM | POA: Diagnosis not present

## 2022-01-08 LAB — CBC
HCT: 35.7 % — ABNORMAL LOW (ref 39.0–52.0)
Hemoglobin: 11.8 g/dL — ABNORMAL LOW (ref 13.0–17.0)
MCH: 28.9 pg (ref 26.0–34.0)
MCHC: 33.1 g/dL (ref 30.0–36.0)
MCV: 87.5 fL (ref 80.0–100.0)
Platelets: 229 10*3/uL (ref 150–400)
RBC: 4.08 MIL/uL — ABNORMAL LOW (ref 4.22–5.81)
RDW: 15.2 % (ref 11.5–15.5)
WBC: 7.1 10*3/uL (ref 4.0–10.5)
nRBC: 0 % (ref 0.0–0.2)

## 2022-01-08 LAB — BASIC METABOLIC PANEL
Anion gap: 11 (ref 5–15)
BUN: 20 mg/dL (ref 8–23)
CO2: 23 mmol/L (ref 22–32)
Calcium: 8.3 mg/dL — ABNORMAL LOW (ref 8.9–10.3)
Chloride: 108 mmol/L (ref 98–111)
Creatinine, Ser: 1.34 mg/dL — ABNORMAL HIGH (ref 0.61–1.24)
GFR, Estimated: 58 mL/min — ABNORMAL LOW (ref >60)
Glucose, Bld: 113 mg/dL — ABNORMAL HIGH (ref 70–99)
Potassium: 4.3 mmol/L (ref 3.5–5.1)
Sodium: 142 mmol/L (ref 135–145)

## 2022-01-08 MED ORDER — ASPIRIN 81 MG PO CHEW: 81.0000 mg | CHEWABLE_TABLET | Freq: Every day | ORAL | 1 refills | Status: AC

## 2022-01-08 MED ORDER — ATENOLOL 25 MG PO TABS
25.0000 mg | ORAL_TABLET | Freq: Every day | ORAL | 2 refills | Status: DC
Start: 2022-01-09 — End: 2022-01-12

## 2022-01-08 MED ORDER — ATORVASTATIN CALCIUM 80 MG PO TABS: 80.0000 mg | ORAL_TABLET | Freq: Every day | ORAL | 2 refills | Status: DC

## 2022-01-08 MED ORDER — LOSARTAN POTASSIUM 50 MG PO TABS: 50.0000 mg | ORAL_TABLET | Freq: Every day | ORAL | 2 refills | Status: AC

## 2022-01-08 MED ORDER — CLOPIDOGREL BISULFATE 75 MG PO TABS: 75.0000 mg | ORAL_TABLET | Freq: Every day | ORAL | 3 refills | Status: DC

## 2022-01-08 NOTE — Progress Notes (Signed)
Pt safely discharged. Discharge packet provided with teach-back method. VS wnL and as per flow. IVs removed, Pt verbalized understanding. All questions and concerns addressed. Awaiting on wife for transport.

## 2022-01-08 NOTE — Progress Notes (Signed)
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 He normally uses a cane and currently declines RW for home. No angina, tolerated well.   Discussed with pt stents, restrictions, Plavix importance, smoking cessation, diet, exercise, NTG and CRPII. Pt receptive. He has significant stressors after his son's death but is motivated to quit smoking by the end of the year. He is cutting down. Will refer to [facility].  [phone]   Ethelda Chick CES, ACSM 01/08/2022 10:28 AM

## 2022-01-09 ENCOUNTER — Other Ambulatory Visit: Payer: Self-pay | Admitting: Student

## 2022-01-09 DIAGNOSIS — N1831 Chronic kidney disease, stage 3a: Secondary | ICD-10-CM

## 2022-01-09 DIAGNOSIS — I10 Essential (primary) hypertension: Secondary | ICD-10-CM

## 2022-01-09 DIAGNOSIS — D649 Anemia, unspecified: Secondary | ICD-10-CM

## 2022-01-09 LAB — LIPOPROTEIN A (LPA): Lipoprotein (a): 30.4 nmol/L — ABNORMAL HIGH (ref <75.0)

## 2022-01-09 NOTE — Progress Notes (Signed)
Ordered repeat CBC and BMET to be checked in 1 week. Please see discharge summary from 01/08/2022 for more information.  Corrin Parker, PA-C 01/09/2022 2:11 PM

## 2022-01-10 ENCOUNTER — Encounter (HOSPITAL_COMMUNITY): Payer: Self-pay | Admitting: Cardiology

## 2022-01-12 ENCOUNTER — Other Ambulatory Visit: Payer: Self-pay | Admitting: Student

## 2022-01-12 DIAGNOSIS — Z79899 Other long term (current) drug therapy: Secondary | ICD-10-CM | POA: Diagnosis not present

## 2022-01-12 DIAGNOSIS — Z09 Encounter for follow-up examination after completed treatment for conditions other than malignant neoplasm: Secondary | ICD-10-CM | POA: Diagnosis not present

## 2022-01-12 DIAGNOSIS — M545 Low back pain, unspecified: Secondary | ICD-10-CM | POA: Diagnosis not present

## 2022-01-13 ENCOUNTER — Telehealth (HOSPITAL_COMMUNITY): Payer: Self-pay

## 2022-01-17 DIAGNOSIS — Z79899 Other long term (current) drug therapy: Secondary | ICD-10-CM | POA: Diagnosis not present

## 2022-01-20 ENCOUNTER — Ambulatory Visit (INDEPENDENT_AMBULATORY_CARE_PROVIDER_SITE_OTHER): Payer: Medicare Other | Admitting: Medical

## 2022-01-20 ENCOUNTER — Encounter: Payer: Self-pay | Admitting: Medical

## 2022-01-20 VITALS — BP 124/79 | HR 56 | Ht 68.0 in | Wt 225.2 lb

## 2022-01-20 DIAGNOSIS — D649 Anemia, unspecified: Secondary | ICD-10-CM

## 2022-01-20 DIAGNOSIS — E785 Hyperlipidemia, unspecified: Secondary | ICD-10-CM

## 2022-01-20 DIAGNOSIS — I2510 Atherosclerotic heart disease of native coronary artery without angina pectoris: Secondary | ICD-10-CM

## 2022-01-20 DIAGNOSIS — I5022 Chronic systolic (congestive) heart failure: Secondary | ICD-10-CM

## 2022-01-20 DIAGNOSIS — F17210 Nicotine dependence, cigarettes, uncomplicated: Secondary | ICD-10-CM | POA: Diagnosis not present

## 2022-01-20 DIAGNOSIS — R9439 Abnormal result of other cardiovascular function study: Secondary | ICD-10-CM

## 2022-01-20 MED ORDER — DAPAGLIFLOZIN PROPANEDIOL 10 MG PO TABS: 10.0000 mg | ORAL_TABLET | Freq: Every day | ORAL | 11 refills | Status: AC

## 2022-01-20 NOTE — Patient Instructions (Signed)
Medication Instructions:  Your physician has recommended you make the following change in your medication:   START Farxiga 10 mg once daily   *If you need a refill on your cardiac medications before your next appointment, please call your pharmacy*   Lab Work: BMET in one week. No appointment is needed. Go to the following place:   Medical Mall Entrance at [facility] 1st desk on the right to check in (REGISTRATION)  Lab hours: Monday- Friday (7:30 am- 5:30 pm)  If you have labs (blood work) drawn today and your tests are completely normal, you will receive your results only by: MyChart Message (if you have MyChart) OR A paper copy in the mail If you have any lab test that is abnormal or we need to change your treatment, we will call you to review the results.   Testing/Procedures: Your physician has requested that you have an echocardiogram. Echocardiography is a painless test that uses sound waves to create images of your heart. It provides your doctor with information about the size and shape of your heart and how well your heart's chambers and valves are working. This procedure takes approximately one hour. There are no restrictions for this procedure.    Follow-Up: At [facility], you and your health needs are our priority.  As part of our continuing mission to provide you with exceptional heart care, we have created designated Provider Care Teams.  These Care Teams include your primary Cardiologist (physician) and Advanced Practice Providers (APPs -  Physician Assistants and Nurse Practitioners) who all work together to provide you with the care you need, when you need it.   Your next appointment:   3 week(s)  The format for your next appointment:   In Person  Provider:   [name], MD or [name], [place]       Important Information About Sugar
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
## 2022-01-26 ENCOUNTER — Other Ambulatory Visit: Payer: Medicare Other

## 2022-02-02 ENCOUNTER — Encounter: Payer: Self-pay | Admitting: Cardiology

## 2022-02-02 ENCOUNTER — Ambulatory Visit (INDEPENDENT_AMBULATORY_CARE_PROVIDER_SITE_OTHER): Payer: Medicare Other

## 2022-02-02 ENCOUNTER — Other Ambulatory Visit
Admission: RE | Admit: 2022-02-02 | Discharge: 2022-02-02 | Disposition: A | Discharge status: Home / Self Care | Payer: Medicare Other | Attending: Medical | Admitting: Medical

## 2022-02-02 DIAGNOSIS — R9439 Abnormal result of other cardiovascular function study: Secondary | ICD-10-CM | POA: Insufficient documentation

## 2022-02-02 DIAGNOSIS — I2510 Atherosclerotic heart disease of native coronary artery without angina pectoris: Secondary | ICD-10-CM | POA: Insufficient documentation

## 2022-02-02 DIAGNOSIS — D649 Anemia, unspecified: Secondary | ICD-10-CM | POA: Diagnosis not present

## 2022-02-02 LAB — BASIC METABOLIC PANEL
Anion gap: 6 (ref 5–15)
BUN: 21 mg/dL (ref 8–23)
CO2: 24 mmol/L (ref 22–32)
Calcium: 9.2 mg/dL (ref 8.9–10.3)
Chloride: 110 mmol/L (ref 98–111)
Creatinine, Ser: 1.16 mg/dL (ref 0.61–1.24)
GFR, Estimated: >60 mL/min (ref >60)
Glucose, Bld: 103 mg/dL — ABNORMAL HIGH (ref 70–99)
Potassium: 5.2 mmol/L — ABNORMAL HIGH (ref 3.5–5.1)
Sodium: 140 mmol/L (ref 135–145)

## 2022-02-03 ENCOUNTER — Telehealth: Payer: Self-pay | Admitting: Emergency Medicine

## 2022-02-03 DIAGNOSIS — R9430 Abnormal result of cardiovascular function study, unspecified: Secondary | ICD-10-CM | POA: Diagnosis not present

## 2022-02-03 DIAGNOSIS — E7800 Pure hypercholesterolemia, unspecified: Secondary | ICD-10-CM | POA: Diagnosis not present

## 2022-02-03 DIAGNOSIS — I77810 Thoracic aortic ectasia: Secondary | ICD-10-CM | POA: Diagnosis not present

## 2022-02-03 LAB — ECHOCARDIOGRAM COMPLETE
AR max vel: 3.23 cm2
AV Area VTI: 3.71 cm2
AV Area mean vel: 3.24 cm2
AV Mean grad: 5 mmHg
AV Peak grad: 11.6 mmHg
Ao pk vel: 1.7 m/s
Area-P 1/2: 2.58 cm2
Calc EF: 51.9 %
S' Lateral: 3.6 cm
Single Plane A2C EF: 50.1 %
Single Plane A4C EF: 53 %

## 2022-02-03 NOTE — Telephone Encounter (Signed)
-----   Message from Cadence David Stall, PA-C sent at 02/03/2022  3:19 PM EDT ----- Labs overall OK. K is minimally high, but we thing this is a lab error given many labs from this day are abnormal. Kidney function is normal

## 2022-02-03 NOTE — Telephone Encounter (Signed)
Called and spoke with patient. Results reviewed with patient, pt verbalized understanding,  questions (if any) answered.   ?

## 2022-02-04 ENCOUNTER — Telehealth: Payer: Self-pay

## 2022-02-04 NOTE — Telephone Encounter (Signed)
Patient made aware of echo results with verbalized understanding. 

## 2022-02-04 NOTE — Telephone Encounter (Signed)
Patient returned RN's call. 

## 2022-02-04 NOTE — Telephone Encounter (Signed)
Called to the patient echo results. Lmtcb.

## 2022-02-04 NOTE — Telephone Encounter (Signed)
-----   Message from Cadence David Stall, PA-C sent at 02/04/2022 10:30 AM EDT ----- Echo showed LVEF 55-60%, which is normal.

## 2022-02-15 DIAGNOSIS — Z91148 Patient's other noncompliance with medication regimen for other reason: Secondary | ICD-10-CM | POA: Diagnosis not present

## 2022-02-15 DIAGNOSIS — M545 Low back pain, unspecified: Secondary | ICD-10-CM | POA: Diagnosis not present

## 2022-02-15 DIAGNOSIS — Z79899 Other long term (current) drug therapy: Secondary | ICD-10-CM | POA: Diagnosis not present

## 2022-02-17 ENCOUNTER — Ambulatory Visit: Payer: Medicare Other | Admitting: Cardiology

## 2022-02-18 DIAGNOSIS — Z79899 Other long term (current) drug therapy: Secondary | ICD-10-CM | POA: Diagnosis not present

## 2022-02-23 ENCOUNTER — Encounter: Payer: Self-pay | Admitting: Cardiology

## 2022-02-23 DIAGNOSIS — I5030 Unspecified diastolic (congestive) heart failure: Secondary | ICD-10-CM | POA: Insufficient documentation

## 2022-02-23 DIAGNOSIS — I5020 Unspecified systolic (congestive) heart failure: Secondary | ICD-10-CM
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
## 2022-02-24 ENCOUNTER — Encounter: Payer: Self-pay | Admitting: Cardiology

## 2022-02-24 ENCOUNTER — Ambulatory Visit: Payer: Medicare Other | Attending: Cardiology | Admitting: Cardiology

## 2022-02-24 VITALS — BP 120/70 | HR 71 | Ht 69.0 in | Wt 233.2 lb

## 2022-02-24 DIAGNOSIS — E785 Hyperlipidemia, unspecified: Secondary | ICD-10-CM | POA: Diagnosis not present

## 2022-02-24 DIAGNOSIS — I25119 Atherosclerotic heart disease of native coronary artery with unspecified angina pectoris: Secondary | ICD-10-CM

## 2022-02-24 DIAGNOSIS — J449 Chronic obstructive pulmonary disease, unspecified: Secondary | ICD-10-CM

## 2022-02-24 DIAGNOSIS — I5032 Chronic diastolic (congestive) heart failure: Secondary | ICD-10-CM

## 2022-02-24 DIAGNOSIS — I5020 Unspecified systolic (congestive) heart failure: Secondary | ICD-10-CM

## 2022-02-24 DIAGNOSIS — I2510 Atherosclerotic heart disease of native coronary artery without angina pectoris: Secondary | ICD-10-CM | POA: Diagnosis not present

## 2022-02-24 DIAGNOSIS — I10 Essential (primary) hypertension: Secondary | ICD-10-CM

## 2022-02-24 DIAGNOSIS — Z9861 Coronary angioplasty status: Secondary | ICD-10-CM

## 2022-02-24 DIAGNOSIS — F17210 Nicotine dependence, cigarettes, uncomplicated: Secondary | ICD-10-CM

## 2022-02-24 DIAGNOSIS — I200 Unstable angina: Secondary | ICD-10-CM | POA: Diagnosis not present

## 2022-02-24 NOTE — Patient Instructions (Signed)
Medication Instructions:  Your Physician recommend you continue on your current medication as directed.    *If you need a refill on your cardiac medications before your next appointment, please call your pharmacy*   Lab Work: Please have your primary cardiologist check lipid panel in 2 months   Testing/Procedures: None ordered today   Follow-Up: At [facility], you and your health needs are our priority.  As part of our continuing mission to provide you with exceptional heart care, we have created designated Provider Care Teams.  These Care Teams include your primary Cardiologist (physician) and Advanced Practice Providers (APPs -  Physician Assistants and Nurse Practitioners) who all work together to provide you with the care you need, when you need it.  We recommend signing up for the patient portal called "MyChart".  Sign up information is provided on this After Visit Summary.  MyChart is used to connect with patients for Virtual Visits (Telemedicine).  Patients are able to view lab/test results, encounter notes, upcoming appointments, etc.  Non-urgent messages can be sent to your provider as well.   To learn more about what you can do with MyChart, go to [email or web address].    Your next appointment:   As needed  The format for your next appointment:   In Person  Provider:   [name], MD

## 2022-02-25 DIAGNOSIS — R918 Other nonspecific abnormal finding of lung field: Secondary | ICD-10-CM | POA: Diagnosis not present

## 2022-02-25 DIAGNOSIS — R911 Solitary pulmonary nodule: Secondary | ICD-10-CM | POA: Diagnosis not present

## 2022-02-25 DIAGNOSIS — I77810 Thoracic aortic ectasia: Secondary | ICD-10-CM | POA: Diagnosis not present

## 2022-02-26 ENCOUNTER — Encounter: Payer: Self-pay | Admitting: Cardiology

## 2022-02-26 NOTE — Assessment & Plan Note (Signed)
Stable BP on current dose of atenolol and losartan.  Atenolol dose was reduced because of bradycardia.  I suspect that he would be better off switching to carvedilol, but defer to his new primary cardiologist

## 2022-02-26 NOTE — Assessment & Plan Note (Signed)
Resolved following cath and PCI.

## 2022-02-26 NOTE — Assessment & Plan Note (Signed)
Defer to primary pulmonologist. Needs smoking cessation.  I suspect this is the main reason for his exertional dyspnea at this point.

## 2022-02-26 NOTE — Assessment & Plan Note (Signed)
Status post 2 site PCI of the OM and LCx with occluded RCA.  Echocardiogram shows normal wall motion in the RCA distribution which is an improvement from the EF and wall motion abnormality noted on Myoview.  This would suggest that collateral filling is providing adequate perfusion.  Plan:  Currently on ASA/Plavix for DAPT.  Trying to make at least 6 months uninterrupted.  See below  He is on atenolol.  I suspect this will probably get changed to a different beta-blocker by his new cardiologist.  Will defer to his primary cardiologist.  Still on losartan.  Still on atorvastatin 80 mg, converted from pravastatin.  Labs should be checked by PCP at next follow-up.  Recommend that he gets it checked 3 months after having converted to atorvastatin post cath.

## 2022-02-26 NOTE — Assessment & Plan Note (Signed)
2 site/vessel DES PCI:   For now continue aspirin and Plavix for minimum of 6 months uninterrupted.  (For urgent procedures could hold at 3 months, but would prefer to wait 6 months).  But then discontinue aspirin and continue Plavix for the remainder of the year post PCI

## 2022-02-26 NOTE — Assessment & Plan Note (Signed)
Counseling provided.  Did not seem to be to extent in quitting.  But his wife does suggest that he has cut back.

## 2022-02-26 NOTE — Assessment & Plan Note (Signed)
EF now 55 to 60% by Echo, which is an improvement from the EF by Myoview.  This would suggest that there is improved inferior perfusion. No comment on inferior hypokinesis which would go along with CTO RCA. (seen in Myoview)

## 2022-03-03 DIAGNOSIS — I10 Essential (primary) hypertension: Secondary | ICD-10-CM | POA: Diagnosis not present

## 2022-03-03 DIAGNOSIS — E7800 Pure hypercholesterolemia, unspecified: Secondary | ICD-10-CM | POA: Diagnosis not present

## 2022-03-03 DIAGNOSIS — I259 Chronic ischemic heart disease, unspecified: Secondary | ICD-10-CM | POA: Diagnosis not present

## 2022-03-03 DIAGNOSIS — I240 Acute coronary thrombosis not resulting in myocardial infarction: Secondary | ICD-10-CM | POA: Diagnosis not present

## 2022-03-03 DIAGNOSIS — J449 Chronic obstructive pulmonary disease, unspecified: Secondary | ICD-10-CM | POA: Diagnosis not present

## 2022-03-14 DIAGNOSIS — I2510 Atherosclerotic heart disease of native coronary artery without angina pectoris: Secondary | ICD-10-CM | POA: Diagnosis not present

## 2022-03-14 DIAGNOSIS — Z9861 Coronary angioplasty status: Secondary | ICD-10-CM | POA: Diagnosis not present

## 2022-03-21 DIAGNOSIS — I2510 Atherosclerotic heart disease of native coronary artery without angina pectoris: Secondary | ICD-10-CM | POA: Diagnosis not present

## 2022-03-21 DIAGNOSIS — Z9861 Coronary angioplasty status: Secondary | ICD-10-CM | POA: Diagnosis not present

## 2022-03-23 DIAGNOSIS — I2510 Atherosclerotic heart disease of native coronary artery without angina pectoris: Secondary | ICD-10-CM | POA: Diagnosis not present

## 2022-03-23 DIAGNOSIS — Z9861 Coronary angioplasty status: Secondary | ICD-10-CM | POA: Diagnosis not present

## 2022-04-06 DIAGNOSIS — G4700 Insomnia, unspecified: Secondary | ICD-10-CM | POA: Diagnosis not present

## 2022-04-06 DIAGNOSIS — Z23 Encounter for immunization: Secondary | ICD-10-CM | POA: Diagnosis not present

## 2022-04-06 DIAGNOSIS — R911 Solitary pulmonary nodule: Secondary | ICD-10-CM | POA: Diagnosis not present

## 2022-04-06 DIAGNOSIS — I10 Essential (primary) hypertension: Secondary | ICD-10-CM | POA: Diagnosis not present

## 2022-04-06 DIAGNOSIS — Z79891 Long term (current) use of opiate analgesic: Secondary | ICD-10-CM | POA: Diagnosis not present

## 2022-04-21 DIAGNOSIS — I10 Essential (primary) hypertension: Secondary | ICD-10-CM | POA: Diagnosis not present

## 2022-04-21 DIAGNOSIS — I240 Acute coronary thrombosis not resulting in myocardial infarction: Secondary | ICD-10-CM | POA: Diagnosis not present

## 2022-04-21 DIAGNOSIS — J449 Chronic obstructive pulmonary disease, unspecified: Secondary | ICD-10-CM | POA: Diagnosis not present

## 2022-04-21 DIAGNOSIS — I259 Chronic ischemic heart disease, unspecified: Secondary | ICD-10-CM | POA: Diagnosis not present

## 2022-04-21 DIAGNOSIS — E7800 Pure hypercholesterolemia, unspecified: Secondary | ICD-10-CM | POA: Diagnosis not present

## 2022-05-25 DIAGNOSIS — Z7901 Long term (current) use of anticoagulants: Secondary | ICD-10-CM | POA: Diagnosis not present

## 2022-05-25 DIAGNOSIS — M25552 Pain in left hip: Secondary | ICD-10-CM | POA: Diagnosis not present

## 2022-05-25 DIAGNOSIS — R9431 Abnormal electrocardiogram [ECG] [EKG]: Secondary | ICD-10-CM | POA: Diagnosis not present

## 2022-05-25 DIAGNOSIS — M545 Low back pain, unspecified: Secondary | ICD-10-CM | POA: Diagnosis not present

## 2022-05-25 DIAGNOSIS — Z5321 Procedure and treatment not carried out due to patient leaving prior to being seen by health care provider: Secondary | ICD-10-CM | POA: Diagnosis not present

## 2022-05-25 DIAGNOSIS — R55 Syncope and collapse: Secondary | ICD-10-CM | POA: Diagnosis not present

## 2022-06-05 IMAGING — RF DG C-ARM 1-60 MIN
2 series · 4 of 4 positions shown · IV contrast (agent unspecified)
Comparison: MRI 10/05/2019.

CLINICAL DATA: L1 kyphoplasty.

EXAM:
DG C-ARM 1-60 MIN; LUMBAR SPINE - COMPLETE 4+ VIEW
CONTRAST:  None.
FLUOROSCOPY TIME:  Fluoroscopy Time:  2 minutes 49 seconds
Radiation Exposure Index (if provided by the fluoroscopic device):
137.2 mGy
Number of Acquired Spot Images: 3

[Series 1: run · 3 of 3 slices shown (1 of 2)]
[im 1/3]
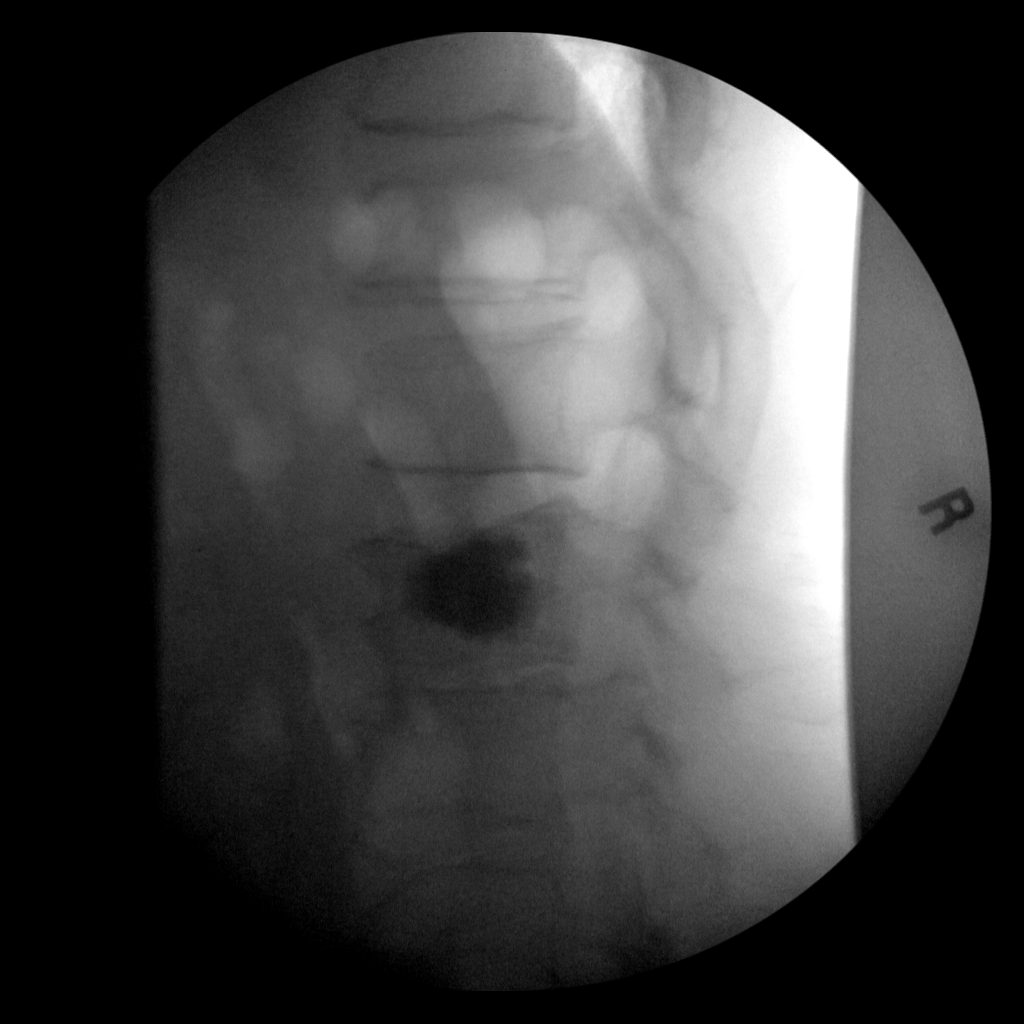
[im 2/3]
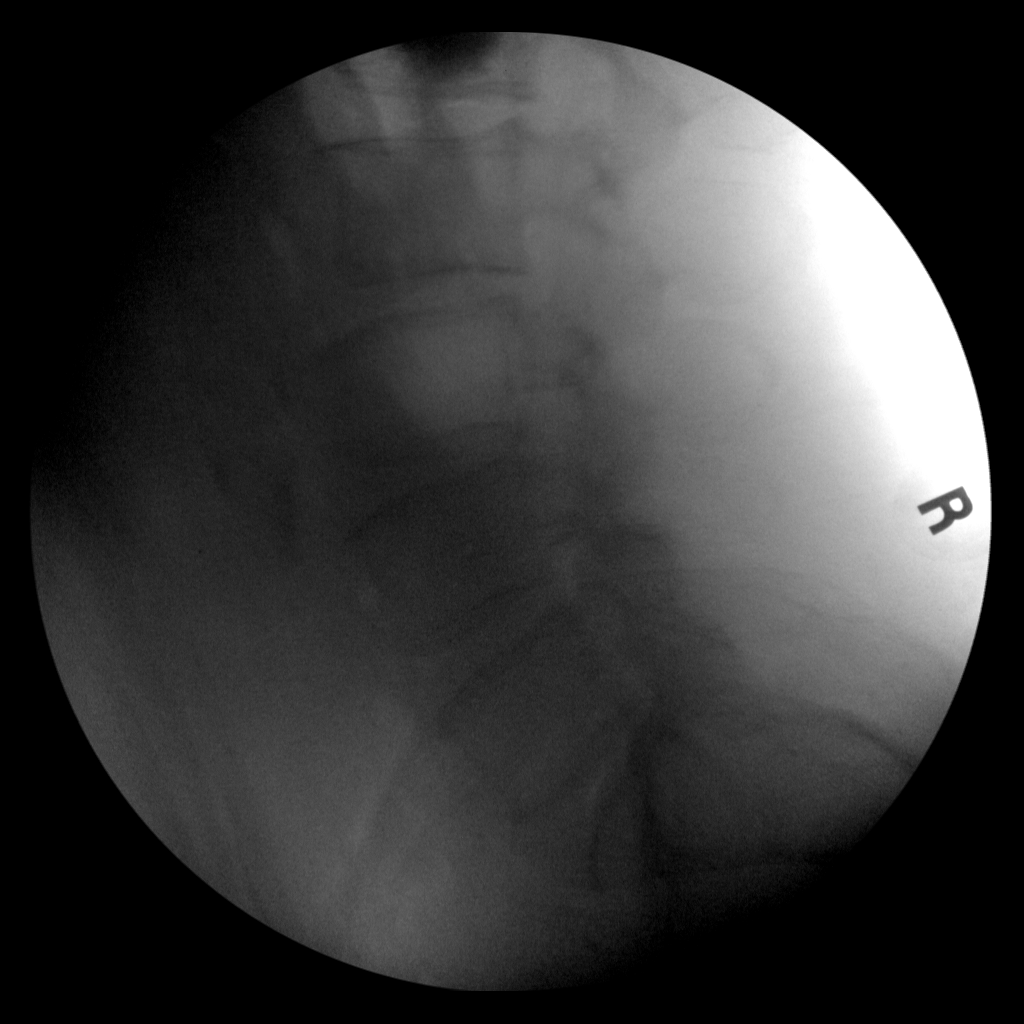
[im 3/3]
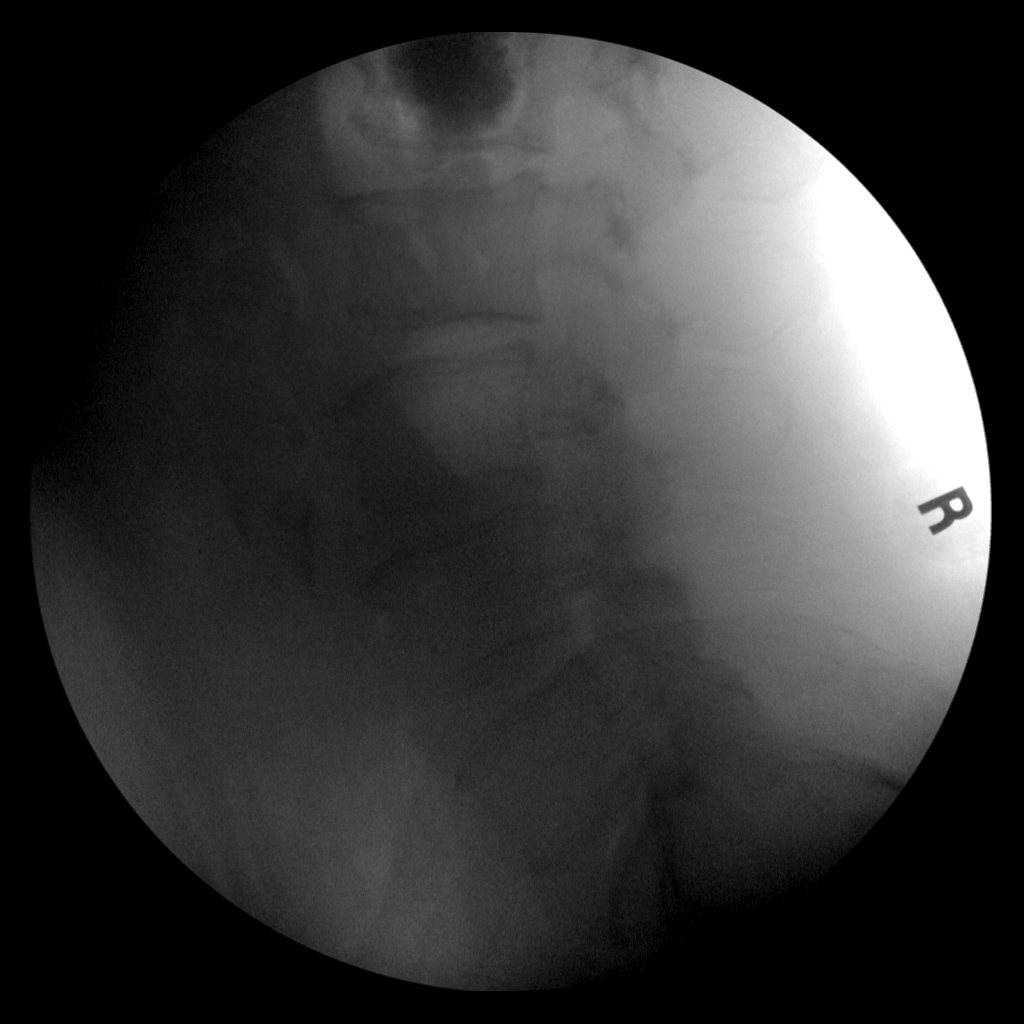

[Series 1: run · 1 of 1 slices shown (2 of 2)]
[im 1/1]
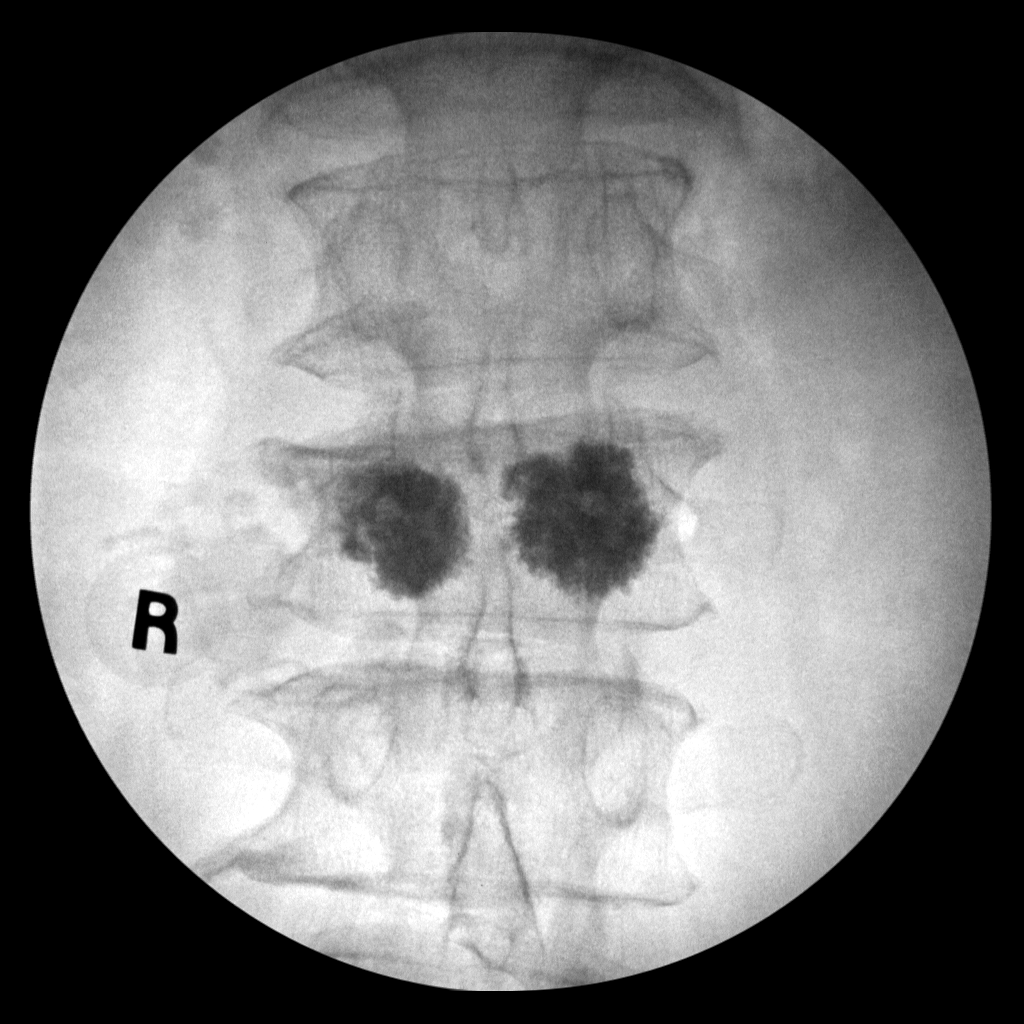

[4 of 4 positions shown; findings below may reference images not displayed]

FINDINGS: Vertebroplasty is been performed of the fractured upper lumbar
vertebra. Diffuse degenerative change. Anatomic alignment.
IMPRESSION: Postsurgical changes lumbar spine.

## 2022-06-05 IMAGING — RF DG LUMBAR SPINE COMPLETE 4+V
1 series · 3 of 3 positions shown · IV contrast (agent unspecified)
Comparison: MRI 10/05/2019.

CLINICAL DATA: L1 kyphoplasty.

EXAM:
DG C-ARM 1-60 MIN; LUMBAR SPINE - COMPLETE 4+ VIEW
CONTRAST:  None.
FLUOROSCOPY TIME:  Fluoroscopy Time:  2 minutes 49 seconds
Radiation Exposure Index (if provided by the fluoroscopic device):
137.2 mGy
Number of Acquired Spot Images: 3

[Series 1: run · 3 of 3 slices shown]
[im 1/3]
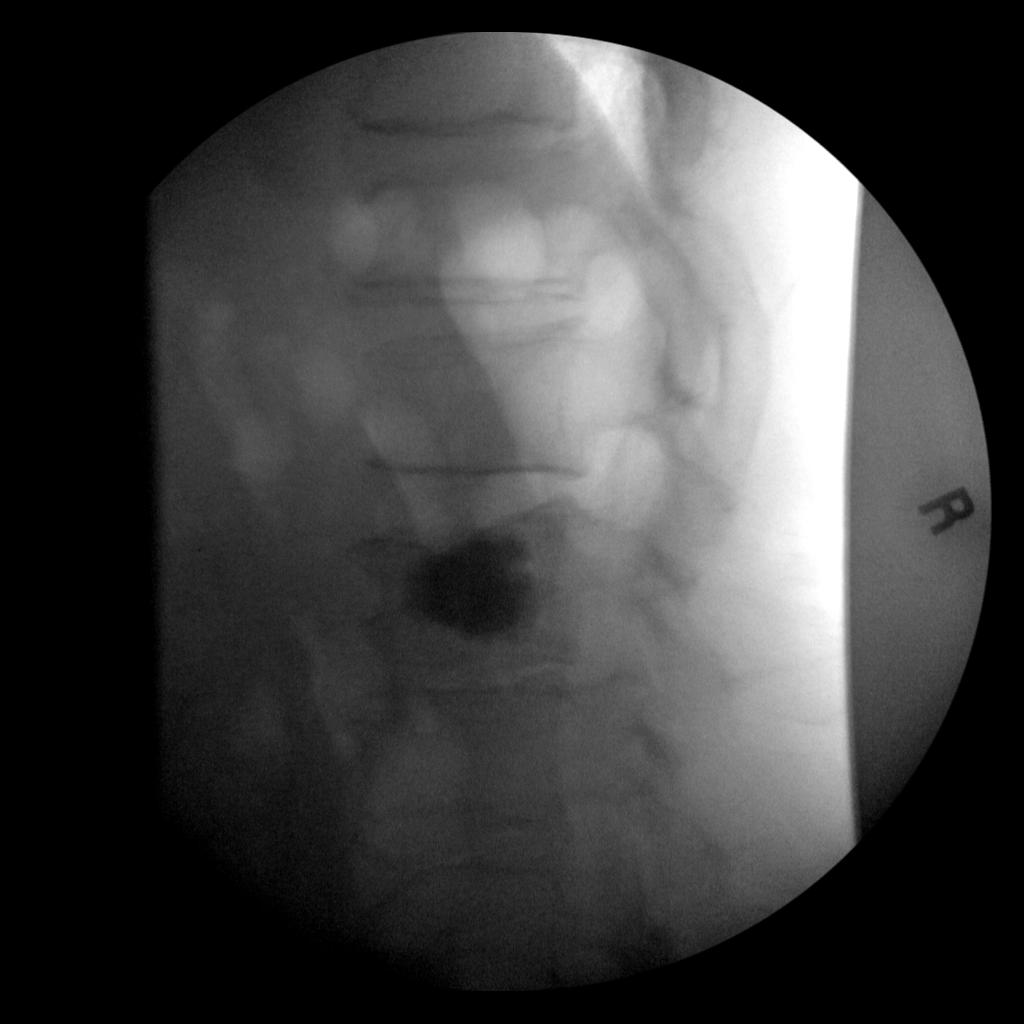
[im 2/3]
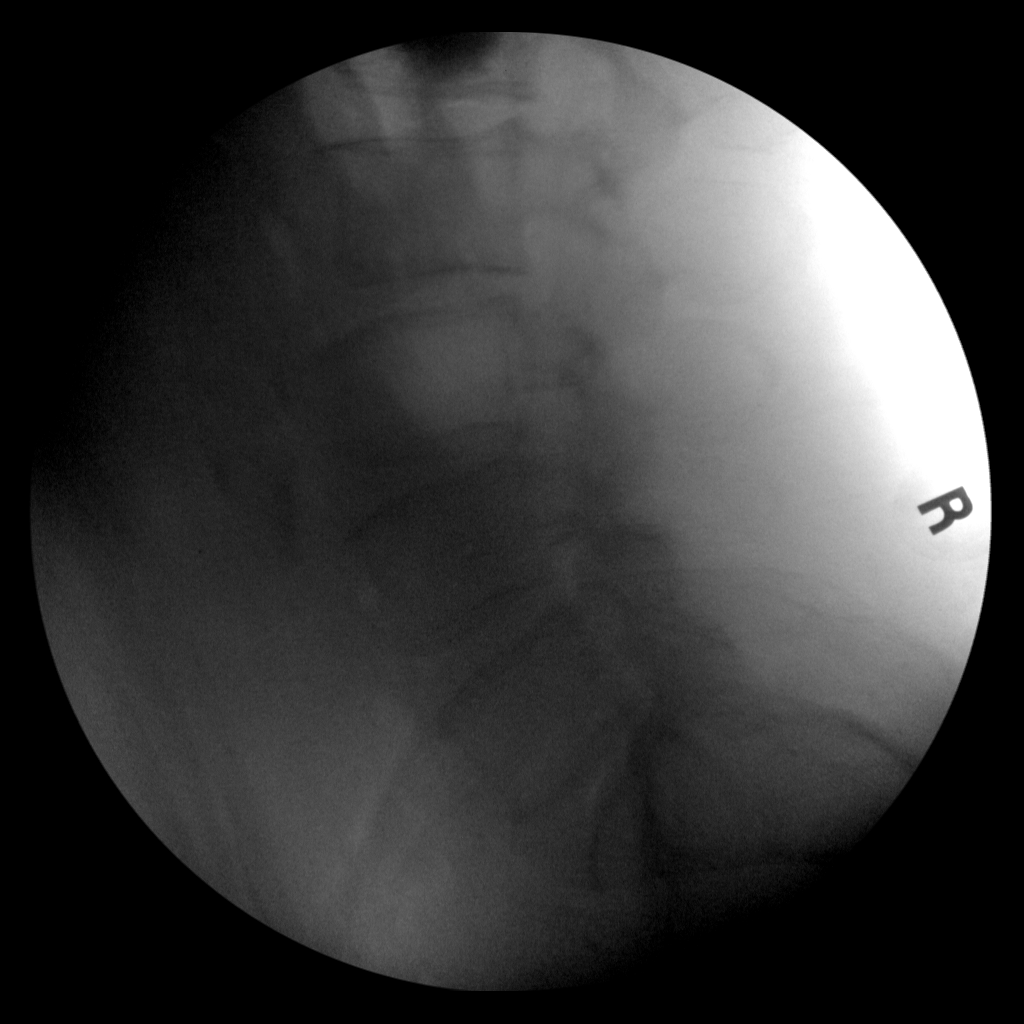
[im 3/3]
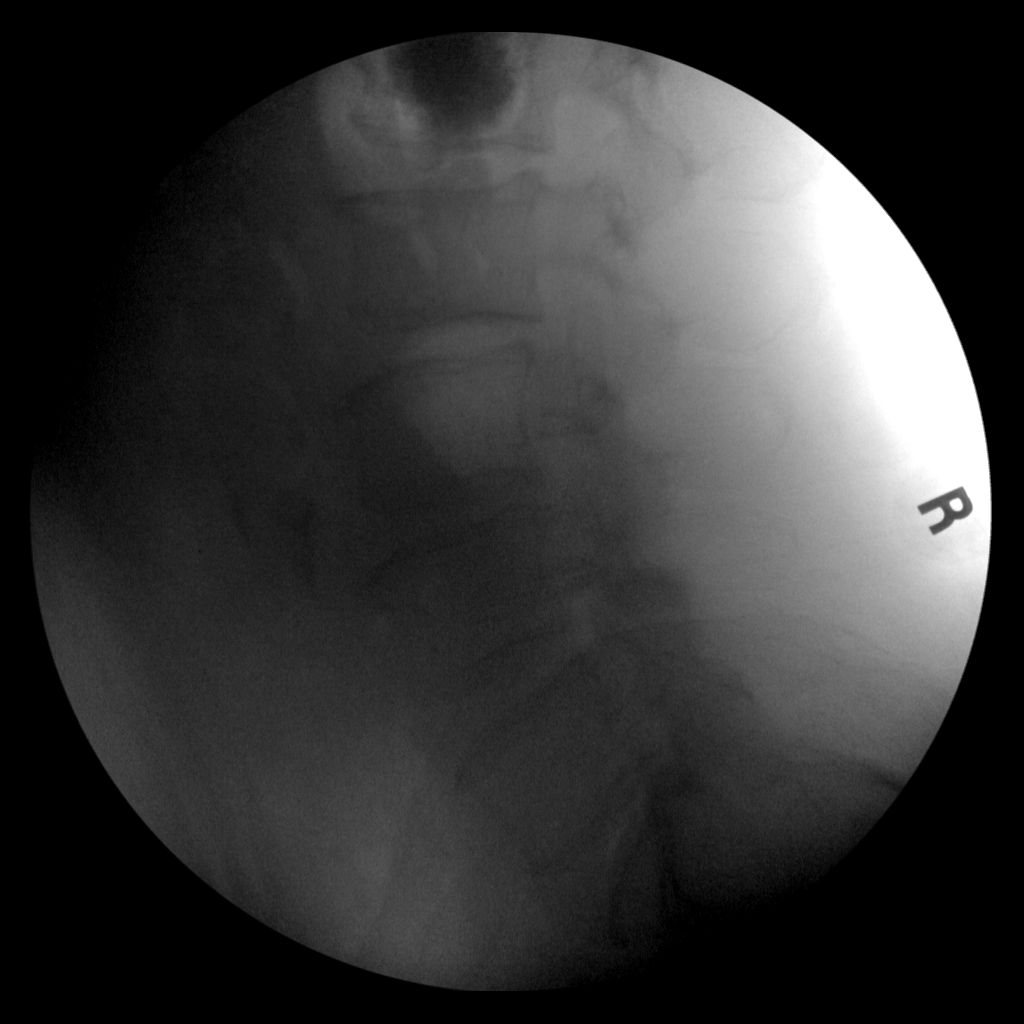

[3 of 3 positions shown; findings below may reference images not displayed]

FINDINGS: Vertebroplasty is been performed of the fractured upper lumbar
vertebra. Diffuse degenerative change. Anatomic alignment.
IMPRESSION: Postsurgical changes lumbar spine.

## 2022-09-21 ENCOUNTER — Other Ambulatory Visit: Payer: Self-pay | Admitting: Student

## 2023-03-11 ENCOUNTER — Other Ambulatory Visit: Payer: Self-pay | Admitting: Student
